# Patient Record
Sex: Female | Born: 1950 | Race: White | Hispanic: No | Marital: Married | Smoking: Former smoker
Health system: Southern US, Community
[De-identification: ages and names within clinical notes are randomized; demographics above are authoritative.]

## PROBLEM LIST (undated history)

## (undated) DIAGNOSIS — M1990 Unspecified osteoarthritis, unspecified site: Secondary | ICD-10-CM

## (undated) DIAGNOSIS — Z9889 Other specified postprocedural states: Secondary | ICD-10-CM

## (undated) DIAGNOSIS — E039 Hypothyroidism, unspecified: Secondary | ICD-10-CM

## (undated) DIAGNOSIS — T8859XA Other complications of anesthesia, initial encounter: Secondary | ICD-10-CM

## (undated) DIAGNOSIS — R112 Nausea with vomiting, unspecified: Secondary | ICD-10-CM

## (undated) HISTORY — PX: HERNIA REPAIR: SHX51

## (undated) HISTORY — PX: TONSILLECTOMY: SUR1361

## (undated) HISTORY — PX: BILATERAL KNEE ARTHROSCOPY: SUR91

---

## 1973-11-29 HISTORY — PX: TUBAL LIGATION: SHX77

## 1975-11-30 DIAGNOSIS — C801 Malignant (primary) neoplasm, unspecified: Secondary | ICD-10-CM

## 1975-11-30 HISTORY — DX: Malignant (primary) neoplasm, unspecified: C80.1

## 1975-11-30 HISTORY — PX: ABDOMINAL HYSTERECTOMY: SHX81

## 1976-08-29 HISTORY — PX: FOOT NEUROMA SURGERY: SHX646

## 1999-05-08 ENCOUNTER — Other Ambulatory Visit: Admission: RE | Admit: 1999-05-08 | Discharge: 1999-05-08 | Payer: Self-pay | Admitting: Obstetrics and Gynecology

## 1999-11-12 ENCOUNTER — Encounter: Payer: Self-pay | Admitting: Family Medicine

## 1999-11-12 ENCOUNTER — Ambulatory Visit (HOSPITAL_COMMUNITY): Admission: RE | Admit: 1999-11-12 | Discharge: 1999-11-12 | Payer: Self-pay | Admitting: Family Medicine

## 2000-01-13 ENCOUNTER — Encounter: Payer: Self-pay | Admitting: *Deleted

## 2000-01-13 ENCOUNTER — Encounter: Admission: RE | Admit: 2000-01-13 | Discharge: 2000-01-13 | Payer: Self-pay | Admitting: *Deleted

## 2000-01-21 ENCOUNTER — Encounter: Admission: RE | Admit: 2000-01-21 | Discharge: 2000-01-21 | Payer: Self-pay | Admitting: *Deleted

## 2000-01-21 ENCOUNTER — Encounter: Payer: Self-pay | Admitting: *Deleted

## 2001-05-04 ENCOUNTER — Encounter: Payer: Self-pay | Admitting: *Deleted

## 2001-05-04 ENCOUNTER — Encounter: Admission: RE | Admit: 2001-05-04 | Discharge: 2001-05-04 | Payer: Self-pay | Admitting: *Deleted

## 2001-05-26 ENCOUNTER — Other Ambulatory Visit: Admission: RE | Admit: 2001-05-26 | Discharge: 2001-05-26 | Payer: Self-pay | Admitting: *Deleted

## 2001-06-07 ENCOUNTER — Encounter: Payer: Self-pay | Admitting: *Deleted

## 2001-06-07 ENCOUNTER — Encounter: Admission: RE | Admit: 2001-06-07 | Discharge: 2001-06-07 | Payer: Self-pay | Admitting: *Deleted

## 2002-04-27 ENCOUNTER — Ambulatory Visit (HOSPITAL_COMMUNITY): Admission: RE | Admit: 2002-04-27 | Discharge: 2002-04-27 | Payer: Self-pay | Admitting: Gastroenterology

## 2002-04-27 ENCOUNTER — Encounter (INDEPENDENT_AMBULATORY_CARE_PROVIDER_SITE_OTHER): Payer: Self-pay

## 2003-01-09 ENCOUNTER — Encounter: Payer: Self-pay | Admitting: Family Medicine

## 2003-01-09 ENCOUNTER — Encounter: Admission: RE | Admit: 2003-01-09 | Discharge: 2003-01-09 | Payer: Self-pay | Admitting: Family Medicine

## 2004-04-30 ENCOUNTER — Other Ambulatory Visit: Admission: RE | Admit: 2004-04-30 | Discharge: 2004-04-30 | Payer: Self-pay | Admitting: Obstetrics & Gynecology

## 2004-09-04 ENCOUNTER — Encounter (INDEPENDENT_AMBULATORY_CARE_PROVIDER_SITE_OTHER): Payer: Self-pay | Admitting: Specialist

## 2004-09-04 ENCOUNTER — Ambulatory Visit (HOSPITAL_COMMUNITY): Admission: RE | Admit: 2004-09-04 | Discharge: 2004-09-04 | Payer: Self-pay | Admitting: General Surgery

## 2004-12-06 ENCOUNTER — Encounter: Admission: RE | Admit: 2004-12-06 | Discharge: 2004-12-06 | Payer: Self-pay | Admitting: Family Medicine

## 2005-05-19 ENCOUNTER — Other Ambulatory Visit: Admission: RE | Admit: 2005-05-19 | Discharge: 2005-05-19 | Payer: Self-pay | Admitting: Obstetrics & Gynecology

## 2005-10-11 ENCOUNTER — Inpatient Hospital Stay (HOSPITAL_COMMUNITY): Admission: RE | Admit: 2005-10-11 | Discharge: 2005-10-13 | Payer: Self-pay | Admitting: Obstetrics & Gynecology

## 2005-10-17 ENCOUNTER — Inpatient Hospital Stay (HOSPITAL_COMMUNITY): Admission: AD | Admit: 2005-10-17 | Discharge: 2005-10-17 | Payer: Self-pay | Admitting: Obstetrics & Gynecology

## 2009-03-28 ENCOUNTER — Encounter: Admission: RE | Admit: 2009-03-28 | Discharge: 2009-03-28 | Payer: Self-pay | Admitting: Family Medicine

## 2010-02-07 ENCOUNTER — Emergency Department (HOSPITAL_COMMUNITY): Admission: EM | Admit: 2010-02-07 | Discharge: 2010-02-07 | Payer: Self-pay | Admitting: Emergency Medicine

## 2011-01-29 ENCOUNTER — Other Ambulatory Visit: Payer: Self-pay | Admitting: Family Medicine

## 2011-01-29 DIAGNOSIS — R52 Pain, unspecified: Secondary | ICD-10-CM

## 2011-01-31 ENCOUNTER — Emergency Department (HOSPITAL_COMMUNITY)
Admission: EM | Admit: 2011-01-31 | Discharge: 2011-01-31 | Disposition: A | Discharge status: Home / Self Care | Payer: BC Managed Care – PPO | Attending: Emergency Medicine | Admitting: Emergency Medicine

## 2011-01-31 ENCOUNTER — Emergency Department (HOSPITAL_COMMUNITY): Payer: BC Managed Care – PPO

## 2011-01-31 DIAGNOSIS — R109 Unspecified abdominal pain: Secondary | ICD-10-CM | POA: Insufficient documentation

## 2011-01-31 DIAGNOSIS — E039 Hypothyroidism, unspecified: Secondary | ICD-10-CM | POA: Insufficient documentation

## 2011-01-31 DIAGNOSIS — Z79899 Other long term (current) drug therapy: Secondary | ICD-10-CM | POA: Insufficient documentation

## 2011-01-31 LAB — DIFFERENTIAL
Basophils Absolute: 0 10*3/uL (ref 0.0–0.1)
Lymphocytes Relative: 29 % (ref 12–46)
Lymphs Abs: 2.1 10*3/uL (ref 0.7–4.0)
Monocytes Absolute: 0.6 10*3/uL (ref 0.1–1.0)
Neutro Abs: 4.5 10*3/uL (ref 1.7–7.7)

## 2011-01-31 LAB — URINALYSIS, ROUTINE W REFLEX MICROSCOPIC
Bilirubin Urine: NEGATIVE
Glucose, UA: NEGATIVE mg/dL
Hgb urine dipstick: NEGATIVE
Nitrite: NEGATIVE
Specific Gravity, Urine: 1.01 (ref 1.005–1.030)
pH: 6 (ref 5.0–8.0)

## 2011-01-31 LAB — COMPREHENSIVE METABOLIC PANEL
ALT: 13 U/L (ref 0–35)
Alkaline Phosphatase: 55 U/L (ref 39–117)
CO2: 27 mEq/L (ref 19–32)
Calcium: 9 mg/dL (ref 8.4–10.5)
GFR calc non Af Amer: >60 mL/min (ref >60)
Glucose, Bld: 94 mg/dL (ref 70–99)
Potassium: 3.9 mEq/L (ref 3.5–5.1)
Sodium: 136 mEq/L (ref 135–145)

## 2011-01-31 LAB — LIPASE, BLOOD: Lipase: 32 U/L (ref 11–59)

## 2011-01-31 LAB — CBC
HCT: 38.5 % (ref 36.0–46.0)
Hemoglobin: 13.4 g/dL (ref 12.0–15.0)
MCHC: 34.8 g/dL (ref 30.0–36.0)

## 2011-01-31 MED ORDER — IOHEXOL 300 MG/ML  SOLN
100.0000 mL | Freq: Once | INTRAMUSCULAR | Status: AC | PRN
  Administered 2011-01-31: 80 mL via INTRAVENOUS

## 2011-02-01 ENCOUNTER — Other Ambulatory Visit: Payer: Self-pay

## 2011-02-21 LAB — COMPREHENSIVE METABOLIC PANEL
ALT: 18 U/L (ref 0–35)
AST: 19 U/L (ref 0–37)
Calcium: 9.1 mg/dL (ref 8.4–10.5)
GFR calc Af Amer: >60 mL/min (ref >60)
Glucose, Bld: 96 mg/dL (ref 70–99)
Sodium: 138 mEq/L (ref 135–145)
Total Protein: 7.1 g/dL (ref 6.0–8.3)

## 2011-02-21 LAB — URINALYSIS, ROUTINE W REFLEX MICROSCOPIC
Bilirubin Urine: NEGATIVE
Ketones, ur: NEGATIVE mg/dL
Nitrite: NEGATIVE
Protein, ur: NEGATIVE mg/dL
Urobilinogen, UA: 0.2 mg/dL (ref 0.0–1.0)

## 2011-02-21 LAB — DIFFERENTIAL
Eosinophils Absolute: 0.1 10*3/uL (ref 0.0–0.7)
Lymphs Abs: 2.1 10*3/uL (ref 0.7–4.0)
Monocytes Relative: 8 % (ref 3–12)
Neutrophils Relative %: 55 % (ref 43–77)

## 2011-02-21 LAB — CBC
MCHC: 32.8 g/dL (ref 30.0–36.0)
RDW: 12.8 % (ref 11.5–15.5)

## 2011-04-16 NOTE — Discharge Summary (Signed)
[name], [name]              ACCOUNT NO.:  [id number]   MEDICAL RECORD NO.:  [id number]          PATIENT TYPE:  INP   LOCATION:  9302                          FACILITY:  [facility]   PHYSICIAN:  Freddy Finner, M.D.   DATE OF BIRTH:  28-Sep-1951   DATE OF ADMISSION:  10/11/2005  DATE OF DISCHARGE:  10/13/2005                                 DISCHARGE SUMMARY   DISCHARGE DIAGNOSES:  First-degree cystocele, third degree rectocele,  vaginal vault descensus.   OPERATIVE PROCEDURE:  Anterior and posterior vaginal repair, sacrospinous  ligament suspension of vagina.   INTRAOPERATIVE AND POSTOPERATIVE COMPLICATIONS:  None.   DISPOSITION:  The patient was in satisfactory condition at the time of her  discharge.  She was voiding without difficulty with minimal residuals. Her  suprapubic catheter was removed.  She was discharged home to resume Vivelle-  Dot 0.1 for estrogen replacement therapy. She is to resume her normal dose  of Synthroid. She was given Percocet 5/325 as needed for postoperative pain.  She is to follow-up in the office in approximately 2 weeks.   Details of present illness, past history, family history of systems and  physical exam recorded in the admission note. Basically, the physical  findings on her admission were remarkable for her cystourethrocele and for  her rectocele. Other physical parameters were normal.   LABORATORY DATA:  Laboratory data during this admission includes admission  hemoglobin of 14.6 with an essentially normal CBC.  Postoperative hemoglobin  was 11.1. Urinalysis on admission was normal.  Prothrombin time and PTT on  admission were normal.   HOSPITAL COURSE:  The patient was admitted on the morning of surgery.  The  above-described procedure was accomplished without intraoperative  difficulties or complications. Her postoperative course was entirely  satisfactory, and by of the morning of the second postoperative day she was  discharged home  with disposition as noted above.      Freddy Finner, M.D.  Electronically Signed     WRN/MEDQ  D:  11/11/2005  T:  11/12/2005  Job:  [id number]

## 2011-04-16 NOTE — Procedures (Signed)
[facility]  Patient:    [name], [name] Visit Number: [id number] MRN: [id number]          Service Type: END Location: ENDO Attending Physician:  Louie Bun Dictated by:   Everardo All Madilyn Fireman, M.D. Proc. Date: 04/27/02 Admit Date:  04/27/2002   CC:         Sharlet Salina T. Hoxworth, M.D.   Procedure Report  PROCEDURE:  Colonoscopy with polypectomy.  INDICATION FOR PROCEDURE:  Rectal bleeding in a 60 year old patient who has a family history of colon polyps in both parents.  DESCRIPTION OF PROCEDURE:  The patient was placed in the left lateral decubitus position and placed on the pulse monitor with continuous low-flow oxygen delivered by nasal cannula.  She was sedated with 20 mg of IV Demerol and 2 mg of IV Versed in addition to the medicine received for the previous EGD.  The Olympus video colonoscope was inserted into the rectum and advanced to the cecum, confirmed by transillumination at McBurneys point and visualization of the ileocecal valve and appendiceal orifice.  The prep was excellent.  The cecum appeared normal.  Within the ascending colon there was seen a sessile 6 mm polyp that was fulgurated by hot biopsy.  The remainder of the ascending and transverse colon appeared normal with no further polyps, masses, diverticula, or other mucosal abnormalities.  Within the descending and sigmoid colon, there were seen several small scattered diverticula.  In the rectum, there was an 8 mm polyp at approximately 12 cm which was removed by snare.  The remainder of the rectum appeared normal down to the anus where retroflexed view revealed no obviously enlarged internal hemorrhoids.  The colonoscope was then withdrawn, and the patient returned to the recovery room in stable condition.  She tolerated the procedure well, and there were no immediate complications.  IMPRESSION: 1. Small ascending and rectal polyps. 2. Sigmoid diverticulosis.  PLAN:   Await histology for determination of interval for next colonoscopy. Dictated by:   Everardo All Madilyn Fireman, M.D. Attending Physician:  Louie Bun DD:  04/27/02 TD:  04/28/02 Job: [id number]

## 2011-04-16 NOTE — Procedures (Signed)
[facility]  Patient:    [name], [name] Visit Number: [id number] MRN: [id number]          Service Type: END Location: ENDO Attending Physician:  Louie Bun Dictated by:   Everardo All Madilyn Fireman, M.D. Proc. Date: 04/27/02 Admit Date:  04/27/2002   CC:         Sharlet Salina T. Hoxworth, M.D.   Procedure Report  PROCEDURE:  Esophagogastroduodenoscopy.  INDICATION FOR PROCEDURE:  Gastroesophageal reflux symptoms only partially suppressed by proton pump inhibitor with some early satiety and bloating in a 60 year old patient, who is also undergoing colonoscopy today for rectal bleeding.  DESCRIPTION OF PROCEDURE:  The patient was placed in the left lateral decubitus position and placed on the pulse monitor with continuous low-flow oxygen delivered by nasal cannula.  She was sedated with 60 mg IV Versed and 6 mg of IV Versed.  The Olympus video endoscope was advanced under direct vision into the oropharynx and esophagus.  The esophagus was straight and of normal caliber with the squamocolumnar line sharply identified at 38 cm. There was no visible hiatal hernia, ring, stricture, or other abnormality of the GE junction or distal esophagus.  The stomach was entered, and a small amount of liquid secretions were suctioned from the fundus.  Retroflexed view of the cardia was unremarkable.  The fundus and body appeared normal.  The antrum showed some increased erythema and diffuse granularity with no focal erosions or ulcers consistent in appearance with a mild antral gastritis.  A CLOtest was obtained.  The pylorus was nondeformed and easily allowed passage of the endoscope tip into the duodenum.  Both the bulb and the second portion were well-inspected and appeared to be within normal limits.  The scope was then withdrawn, and the patient returned to the recovery room in stable condition.  She tolerated the procedure well, and there were no  immediate complications.  IMPRESSION: 1. Mild antral gastritis. 2. Otherwise normal endoscopy.  PLAN: 1. Await CLOtest. 2. We will proceed with colonoscopy as scheduled. Dictated by:   Everardo All Madilyn Fireman, M.D. Attending Physician:  Louie Bun DD:  04/27/02 TD:  04/28/02 Job: [id number]

## 2011-04-16 NOTE — Op Note (Signed)
[name], [name]              ACCOUNT NO.:  [id number]   MEDICAL RECORD NO.:  [id number]          PATIENT TYPE:  INP   LOCATION:  9399                          FACILITY:  [facility]   PHYSICIAN:  Freddy Finner, M.D.   DATE OF BIRTH:  04-Oct-1951   DATE OF PROCEDURE:  10/11/2005  DATE OF DISCHARGE:                                 OPERATIVE REPORT   PREOPERATIVE DIAGNOSES:  Third degree rectocele, first degree  cystourethrocele, vaginal vault descensus.   POSTOPERATIVE DIAGNOSES:  Third degree rectocele, first degree  cystourethrocele, vaginal vault descensus.   OPERATION/PROCEDURE:  Anterior and posterior vaginal repair, sacrospinous  ligament suspension of vagina.   SURGEON:  Dr. Jennette Kettle   ASSISTANT:  Dr. Arelia Sneddon   ANESTHESIA:  Spinal with IV sedation.   ESTIMATED INTRAOPERATIVE BLOOD LOSS:  Less than or equal to 100 mL.   INTRAOPERATIVE COMPLICATIONS:  None.   Details of the present illness are recorded in the admission note.  Patient  was admitted on the morning of surgery.  She was given a bolus of Rocephin  IV preoperatively.  She was brought to the operating room.  She was placed  in PAS compression hose.  She was placed under adequate spinal anesthesia.  She was placed in the dorsal lithotomy position using the Franklin stirrups  system.  She was given IV sedation.  The mons, perineum, and vagina were  prepped in the usual fashion with Betadine scrub followed by Betadine  solution.  Sterile drapes were applied.  Posterior weighted vaginal  retractor was placed.  Anterior vaginal mucosa along the anterior wall was  grasped with Lavena Bullion' from a level approximately 1 cm distal to the vaginal  cuff.  A midline incision was made sharply.  This was extended from along  the anterior wall to level approximately 1 cm proximal to the urethral  meatus.  Allis clamps were used to grasp the mucosal edges.  Careful sharp  and blunt dissection was carried out to free the urogenital fascia from  the  bladder and from the vaginal mucosa.  Plication sutures of 0 Monocryl were  then placed to elevate the urethrovesical angle and to reduce the cystocele.  Segments of mucosa were excised.  Urethral length was checked and patency of  urethra checked with a sterile Kelly.  Urethral length was at least 2.5 cm.  The incision was then closed with a running 2-0 Monocryl suture.  Attention  was turned posteriorly.  Fourchette was grasped on each side with Allis'.  A  parametal shaped segment of skin was excised from the perineal body with  apex inferiorly.  Mucosa overlying the rectum was then tented with Allis'  and an incision made along the posterior midline of the mucosa.  Again,  careful blunt and sharp dissection was used to free the perirectal tissue  from the vaginal mucosa.  Dissection was carried to a level near the apex of  the vagina.  Using a __________  device and a Vicryl suture, suture was  placed in the right sacrospinous ligament midway between the spine and the  sacrum.  This  was anchored to the vaginal mucosa near the apex of the  vagina.  Plication sutures of 0 Monocryl were then used to recreate a  rectovaginal septum using perirectal tissue laterally on each side.  Levators were elevated and approximated but the perineal body was closed  also with an interrupted 0 Monocryl suture.  Segments of mucosa were  excised.  The posterior closure was started running 2-0 Monocryl along the  posterior vaginal incision.  The sacrospinous ligament suspension stitch was  then tied adequately elevating the vaginal vault.  The closure was completed  __________ the episiotomy with the running 2-0 Monocryl suture.  Bladder was  then filled with 300 mL of irrigating solution  __________  catheter was placed without difficulty and anchored to the skin  with interrupted nylon sutures.  Bladder was evacuated, 2-inch plain gauze  pack was placed.  Patient was then awakened and taken to the  recovery room  in good condition.      Freddy Finner, M.D.  Electronically Signed     WRN/MEDQ  D:  10/11/2005  T:  10/11/2005  Job:  [id number]

## 2011-04-16 NOTE — H&P (Signed)
[name], [name]              ACCOUNT NO.:  [id number]   MEDICAL RECORD NO.:  [id number]          PATIENT TYPE:  AMB   LOCATION:  SDC                           FACILITY:  [facility]   PHYSICIAN:  Freddy Finner, M.D.   DATE OF BIRTH:  July 18, 1951   DATE OF ADMISSION:  DATE OF DISCHARGE:                                HISTORY & PHYSICAL   ADMISSION DIAGNOSIS:  Rectocele with third degree prolapse, first degree  cystourethrocele, symptomatic.   HISTORY OF PRESENT ILLNESS:  The patient is a 60 year old white married  female, status post total vaginal hysterectomy, gravida 3, para 2, who is  currently postmenopausal and on hormone replacement therapy including  Vivelle 0.075 twice weekly and on Estrace vaginal cream.  Her menopausal  symptoms have adequately responded to hormone replacement therapy, but she  has progressively increasing difficulty with pressure, difficulty evacuating  her bowel, and urinary stress incontinence.  She is admitted now for  anterior and posterior vaginal repair, sacrospinous ligament suspension.  The patient is known to be hypothyroid.  She has no other known medical  conditions.  For that, she takes Synthroid.  Her only other medications are  listed above.  Her Synthroid dose is 0.75 mg.   PAST SURGICAL HISTORY:  Excision of a tumor on her foot, total vaginal  hysterectomy done in 1977.  She has had two vaginal births.   ALLERGIES:  SOME PAIN MEDICATIONS which cause nausea and vomiting, but is  not allergic to codeine.   SOCIAL HISTORY:  She drinks alcohol occasionally.  She is a nonsmoker.  She  has never had a blood transfusion.   FAMILY HISTORY:  Remarkable for heart disease in her father and hypertension  in her mother.   PHYSICAL EXAMINATION:  HEENT:  Grossly within normal limits.  VITAL SIGNS:  Blood pressure in the office 100/64, thyroid gland is not  palpably enlarged.  CHEST:  Clear to auscultation.  HEART:  Normal sinus rhythm without  murmurs, rubs, or gallops.  BREASTS:  Normal.  There is no palpable nodularity.  No skin change, no  nipple discharge.  ABDOMEN:  Soft and nontender without appreciable organomegaly or palpable  masses.  PELVIC:  External genitalia and vagina are normal to inspection except for  the relaxation of the vaginal outlet.  Third degree rectocele and first  degree cystocele.  Bimanual reveals no palpable masses.  RECTAL:  Palpably normal.   Symptomatic pelvic relaxation with third degree rectocele, first degree  cystourethrocele, descent of vaginal vault.   PLAN:  Anterior and posterior vaginal repair, sacrospinous ligament  suspension.     Freddy Finner, M.D.  Electronically Signed    WRN/MEDQ  D:  10/10/2005  T:  10/10/2005  Job:  [id number]
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
## 2012-02-28 ENCOUNTER — Encounter: Payer: BC Managed Care – PPO | Admitting: Physical Therapy

## 2012-03-10 ENCOUNTER — Ambulatory Visit
Admission: RE | Admit: 2012-03-10 | Discharge: 2012-03-10 | Disposition: A | Discharge status: Home / Self Care | Payer: BC Managed Care – PPO | Source: Ambulatory Visit | Attending: Family Medicine | Admitting: Family Medicine

## 2012-03-10 ENCOUNTER — Other Ambulatory Visit: Payer: Self-pay | Admitting: Family Medicine

## 2012-03-10 DIAGNOSIS — R1031 Right lower quadrant pain: Secondary | ICD-10-CM

## 2012-03-10 MED ORDER — IOHEXOL 300 MG/ML  SOLN
100.0000 mL | Freq: Once | INTRAMUSCULAR | Status: AC | PRN
  Administered 2012-03-10: 100 mL via INTRAVENOUS

## 2012-11-29 HISTORY — PX: ANTERIOR AND POSTERIOR REPAIR: SHX1172

## 2014-11-29 DIAGNOSIS — I209 Angina pectoris, unspecified: Secondary | ICD-10-CM

## 2014-11-29 HISTORY — DX: Angina pectoris, unspecified: I20.9

## 2014-11-29 HISTORY — PX: APPENDECTOMY: SHX54

## 2015-02-27 ENCOUNTER — Other Ambulatory Visit: Payer: Self-pay | Admitting: Family Medicine

## 2015-02-27 DIAGNOSIS — R1030 Lower abdominal pain, unspecified: Secondary | ICD-10-CM

## 2015-02-28 ENCOUNTER — Ambulatory Visit
Admission: RE | Admit: 2015-02-28 | Discharge: 2015-02-28 | Disposition: A | Discharge status: Home / Self Care | Payer: BLUE CROSS/BLUE SHIELD | Source: Ambulatory Visit | Attending: Family Medicine | Admitting: Family Medicine

## 2015-02-28 DIAGNOSIS — R1030 Lower abdominal pain, unspecified: Secondary | ICD-10-CM

## 2015-02-28 MED ORDER — IOPAMIDOL (ISOVUE-300) INJECTION 61%
100.0000 mL | Freq: Once | INTRAVENOUS | Status: AC | PRN
  Administered 2015-02-28: 100 mL via INTRAVENOUS

## 2015-06-25 ENCOUNTER — Other Ambulatory Visit: Payer: Self-pay | Admitting: Orthopedic Surgery

## 2015-06-25 ENCOUNTER — Ambulatory Visit
Admission: RE | Admit: 2015-06-25 | Discharge: 2015-06-25 | Disposition: A | Discharge status: Home / Self Care | Payer: BLUE CROSS/BLUE SHIELD | Source: Ambulatory Visit | Attending: Orthopedic Surgery | Admitting: Orthopedic Surgery

## 2015-06-25 DIAGNOSIS — M79671 Pain in right foot: Secondary | ICD-10-CM

## 2015-07-29 ENCOUNTER — Other Ambulatory Visit: Payer: Self-pay | Admitting: Orthopedic Surgery

## 2016-03-04 ENCOUNTER — Other Ambulatory Visit: Payer: Self-pay | Admitting: Obstetrics and Gynecology

## 2016-03-05 LAB — CYTOLOGY - PAP

## 2017-03-11 DIAGNOSIS — J309 Allergic rhinitis, unspecified: Secondary | ICD-10-CM | POA: Diagnosis not present

## 2017-07-27 DIAGNOSIS — R69 Illness, unspecified: Secondary | ICD-10-CM | POA: Diagnosis not present

## 2017-10-10 DIAGNOSIS — Z23 Encounter for immunization: Secondary | ICD-10-CM | POA: Diagnosis not present

## 2017-10-10 DIAGNOSIS — J309 Allergic rhinitis, unspecified: Secondary | ICD-10-CM | POA: Diagnosis not present

## 2017-10-10 DIAGNOSIS — R109 Unspecified abdominal pain: Secondary | ICD-10-CM | POA: Diagnosis not present

## 2017-10-10 DIAGNOSIS — Z0000 Encounter for general adult medical examination without abnormal findings: Secondary | ICD-10-CM | POA: Diagnosis not present

## 2017-10-10 DIAGNOSIS — Z1231 Encounter for screening mammogram for malignant neoplasm of breast: Secondary | ICD-10-CM | POA: Diagnosis not present

## 2017-10-10 DIAGNOSIS — E7800 Pure hypercholesterolemia, unspecified: Secondary | ICD-10-CM | POA: Diagnosis not present

## 2017-10-10 DIAGNOSIS — K582 Mixed irritable bowel syndrome: Secondary | ICD-10-CM | POA: Diagnosis not present

## 2017-10-10 DIAGNOSIS — E039 Hypothyroidism, unspecified: Secondary | ICD-10-CM | POA: Diagnosis not present

## 2017-10-10 DIAGNOSIS — R69 Illness, unspecified: Secondary | ICD-10-CM | POA: Diagnosis not present

## 2017-10-10 DIAGNOSIS — E2839 Other primary ovarian failure: Secondary | ICD-10-CM | POA: Diagnosis not present

## 2017-12-14 DIAGNOSIS — E2839 Other primary ovarian failure: Secondary | ICD-10-CM | POA: Diagnosis not present

## 2017-12-23 DIAGNOSIS — H2513 Age-related nuclear cataract, bilateral: Secondary | ICD-10-CM | POA: Diagnosis not present

## 2017-12-23 DIAGNOSIS — H43813 Vitreous degeneration, bilateral: Secondary | ICD-10-CM | POA: Diagnosis not present

## 2017-12-23 DIAGNOSIS — H524 Presbyopia: Secondary | ICD-10-CM | POA: Diagnosis not present

## 2017-12-23 DIAGNOSIS — H04123 Dry eye syndrome of bilateral lacrimal glands: Secondary | ICD-10-CM | POA: Diagnosis not present

## 2017-12-23 DIAGNOSIS — H02885 Meibomian gland dysfunction left lower eyelid: Secondary | ICD-10-CM | POA: Diagnosis not present

## 2017-12-23 DIAGNOSIS — H52223 Regular astigmatism, bilateral: Secondary | ICD-10-CM | POA: Diagnosis not present

## 2017-12-28 DIAGNOSIS — M25562 Pain in left knee: Secondary | ICD-10-CM | POA: Diagnosis not present

## 2018-01-10 DIAGNOSIS — E7800 Pure hypercholesterolemia, unspecified: Secondary | ICD-10-CM | POA: Diagnosis not present

## 2018-01-16 DIAGNOSIS — H00014 Hordeolum externum left upper eyelid: Secondary | ICD-10-CM | POA: Diagnosis not present

## 2018-01-16 DIAGNOSIS — H2513 Age-related nuclear cataract, bilateral: Secondary | ICD-10-CM | POA: Diagnosis not present

## 2018-01-16 DIAGNOSIS — H02885 Meibomian gland dysfunction left lower eyelid: Secondary | ICD-10-CM | POA: Diagnosis not present

## 2018-01-16 DIAGNOSIS — H43813 Vitreous degeneration, bilateral: Secondary | ICD-10-CM | POA: Diagnosis not present

## 2018-01-16 DIAGNOSIS — H04123 Dry eye syndrome of bilateral lacrimal glands: Secondary | ICD-10-CM | POA: Diagnosis not present

## 2018-01-19 DIAGNOSIS — M654 Radial styloid tenosynovitis [de Quervain]: Secondary | ICD-10-CM | POA: Diagnosis not present

## 2018-03-07 DIAGNOSIS — M654 Radial styloid tenosynovitis [de Quervain]: Secondary | ICD-10-CM | POA: Diagnosis not present

## 2018-05-22 DIAGNOSIS — M654 Radial styloid tenosynovitis [de Quervain]: Secondary | ICD-10-CM | POA: Diagnosis not present

## 2018-08-25 DIAGNOSIS — Z23 Encounter for immunization: Secondary | ICD-10-CM | POA: Diagnosis not present

## 2018-10-11 DIAGNOSIS — K582 Mixed irritable bowel syndrome: Secondary | ICD-10-CM | POA: Diagnosis not present

## 2018-10-11 DIAGNOSIS — J309 Allergic rhinitis, unspecified: Secondary | ICD-10-CM | POA: Diagnosis not present

## 2018-10-11 DIAGNOSIS — Z1239 Encounter for other screening for malignant neoplasm of breast: Secondary | ICD-10-CM | POA: Diagnosis not present

## 2018-10-11 DIAGNOSIS — E7800 Pure hypercholesterolemia, unspecified: Secondary | ICD-10-CM | POA: Diagnosis not present

## 2018-10-11 DIAGNOSIS — J0100 Acute maxillary sinusitis, unspecified: Secondary | ICD-10-CM | POA: Diagnosis not present

## 2018-10-11 DIAGNOSIS — Z1159 Encounter for screening for other viral diseases: Secondary | ICD-10-CM | POA: Diagnosis not present

## 2018-10-11 DIAGNOSIS — Z0000 Encounter for general adult medical examination without abnormal findings: Secondary | ICD-10-CM | POA: Diagnosis not present

## 2018-10-11 DIAGNOSIS — Z23 Encounter for immunization: Secondary | ICD-10-CM | POA: Diagnosis not present

## 2018-10-11 DIAGNOSIS — E039 Hypothyroidism, unspecified: Secondary | ICD-10-CM | POA: Diagnosis not present

## 2018-12-19 DIAGNOSIS — H2513 Age-related nuclear cataract, bilateral: Secondary | ICD-10-CM | POA: Diagnosis not present

## 2018-12-19 DIAGNOSIS — H43813 Vitreous degeneration, bilateral: Secondary | ICD-10-CM | POA: Diagnosis not present

## 2018-12-19 DIAGNOSIS — H52223 Regular astigmatism, bilateral: Secondary | ICD-10-CM | POA: Diagnosis not present

## 2018-12-19 DIAGNOSIS — H524 Presbyopia: Secondary | ICD-10-CM | POA: Diagnosis not present

## 2018-12-19 DIAGNOSIS — H04123 Dry eye syndrome of bilateral lacrimal glands: Secondary | ICD-10-CM | POA: Diagnosis not present

## 2018-12-19 DIAGNOSIS — H02885 Meibomian gland dysfunction left lower eyelid: Secondary | ICD-10-CM | POA: Diagnosis not present

## 2019-01-04 DIAGNOSIS — M654 Radial styloid tenosynovitis [de Quervain]: Secondary | ICD-10-CM | POA: Diagnosis not present

## 2019-01-19 DIAGNOSIS — Z0100 Encounter for examination of eyes and vision without abnormal findings: Secondary | ICD-10-CM | POA: Diagnosis not present

## 2019-03-20 DIAGNOSIS — M654 Radial styloid tenosynovitis [de Quervain]: Secondary | ICD-10-CM | POA: Diagnosis not present

## 2019-07-09 ENCOUNTER — Other Ambulatory Visit: Payer: Self-pay | Admitting: Family Medicine

## 2019-07-09 DIAGNOSIS — Z1231 Encounter for screening mammogram for malignant neoplasm of breast: Secondary | ICD-10-CM

## 2019-07-12 ENCOUNTER — Ambulatory Visit
Admission: RE | Admit: 2019-07-12 | Discharge: 2019-07-12 | Disposition: A | Discharge status: Home / Self Care | Payer: BLUE CROSS/BLUE SHIELD | Source: Ambulatory Visit | Attending: Family Medicine | Admitting: Family Medicine

## 2019-07-12 ENCOUNTER — Other Ambulatory Visit: Payer: Self-pay

## 2019-07-12 DIAGNOSIS — Z1231 Encounter for screening mammogram for malignant neoplasm of breast: Secondary | ICD-10-CM | POA: Diagnosis not present

## 2019-08-14 DIAGNOSIS — R69 Illness, unspecified: Secondary | ICD-10-CM | POA: Diagnosis not present

## 2019-10-22 DIAGNOSIS — E7800 Pure hypercholesterolemia, unspecified: Secondary | ICD-10-CM | POA: Diagnosis not present

## 2019-10-22 DIAGNOSIS — Z0000 Encounter for general adult medical examination without abnormal findings: Secondary | ICD-10-CM | POA: Diagnosis not present

## 2019-10-22 DIAGNOSIS — K582 Mixed irritable bowel syndrome: Secondary | ICD-10-CM | POA: Diagnosis not present

## 2019-10-22 DIAGNOSIS — E039 Hypothyroidism, unspecified: Secondary | ICD-10-CM | POA: Diagnosis not present

## 2019-11-14 DIAGNOSIS — D225 Melanocytic nevi of trunk: Secondary | ICD-10-CM | POA: Diagnosis not present

## 2019-11-14 DIAGNOSIS — L821 Other seborrheic keratosis: Secondary | ICD-10-CM | POA: Diagnosis not present

## 2019-11-14 DIAGNOSIS — D2272 Melanocytic nevi of left lower limb, including hip: Secondary | ICD-10-CM | POA: Diagnosis not present

## 2019-11-14 DIAGNOSIS — D2271 Melanocytic nevi of right lower limb, including hip: Secondary | ICD-10-CM | POA: Diagnosis not present

## 2019-11-14 DIAGNOSIS — D1801 Hemangioma of skin and subcutaneous tissue: Secondary | ICD-10-CM | POA: Diagnosis not present

## 2019-11-14 DIAGNOSIS — L814 Other melanin hyperpigmentation: Secondary | ICD-10-CM | POA: Diagnosis not present

## 2019-12-25 DIAGNOSIS — H43813 Vitreous degeneration, bilateral: Secondary | ICD-10-CM | POA: Diagnosis not present

## 2019-12-25 DIAGNOSIS — H02885 Meibomian gland dysfunction left lower eyelid: Secondary | ICD-10-CM | POA: Diagnosis not present

## 2019-12-25 DIAGNOSIS — H524 Presbyopia: Secondary | ICD-10-CM | POA: Diagnosis not present

## 2019-12-25 DIAGNOSIS — H04123 Dry eye syndrome of bilateral lacrimal glands: Secondary | ICD-10-CM | POA: Diagnosis not present

## 2019-12-25 DIAGNOSIS — H2513 Age-related nuclear cataract, bilateral: Secondary | ICD-10-CM | POA: Diagnosis not present

## 2019-12-25 DIAGNOSIS — H52223 Regular astigmatism, bilateral: Secondary | ICD-10-CM | POA: Diagnosis not present

## 2020-04-23 DIAGNOSIS — E7800 Pure hypercholesterolemia, unspecified: Secondary | ICD-10-CM | POA: Diagnosis not present

## 2020-04-23 DIAGNOSIS — E039 Hypothyroidism, unspecified: Secondary | ICD-10-CM | POA: Diagnosis not present

## 2020-04-30 DIAGNOSIS — R1909 Other intra-abdominal and pelvic swelling, mass and lump: Secondary | ICD-10-CM | POA: Diagnosis not present

## 2020-04-30 DIAGNOSIS — E039 Hypothyroidism, unspecified: Secondary | ICD-10-CM | POA: Diagnosis not present

## 2020-05-02 ENCOUNTER — Other Ambulatory Visit: Payer: Self-pay | Admitting: Physician Assistant

## 2020-05-02 DIAGNOSIS — R1909 Other intra-abdominal and pelvic swelling, mass and lump: Secondary | ICD-10-CM

## 2020-05-19 ENCOUNTER — Ambulatory Visit
Admission: RE | Admit: 2020-05-19 | Discharge: 2020-05-19 | Disposition: A | Discharge status: Home / Self Care | Payer: Medicare HMO | Source: Ambulatory Visit | Attending: Physician Assistant | Admitting: Physician Assistant

## 2020-05-19 DIAGNOSIS — R1909 Other intra-abdominal and pelvic swelling, mass and lump: Secondary | ICD-10-CM

## 2020-05-22 DIAGNOSIS — M5416 Radiculopathy, lumbar region: Secondary | ICD-10-CM | POA: Diagnosis not present

## 2020-06-04 DIAGNOSIS — M5416 Radiculopathy, lumbar region: Secondary | ICD-10-CM | POA: Diagnosis not present

## 2020-06-04 DIAGNOSIS — R030 Elevated blood-pressure reading, without diagnosis of hypertension: Secondary | ICD-10-CM | POA: Diagnosis not present

## 2020-06-10 DIAGNOSIS — M5127 Other intervertebral disc displacement, lumbosacral region: Secondary | ICD-10-CM | POA: Diagnosis not present

## 2020-06-10 DIAGNOSIS — M5416 Radiculopathy, lumbar region: Secondary | ICD-10-CM | POA: Diagnosis not present

## 2020-06-12 DIAGNOSIS — K4090 Unilateral inguinal hernia, without obstruction or gangrene, not specified as recurrent: Secondary | ICD-10-CM | POA: Diagnosis not present

## 2020-06-15 ENCOUNTER — Ambulatory Visit: Payer: Self-pay | Admitting: General Surgery

## 2020-06-15 NOTE — H&P (Signed)
[name] Appointment: 06/12/2020 10:30 AM Location: [facility] Patient #: [id number] DOB: [birth year] Married / Language: Cleophus Molt / Race: White Female  History of Present Illness Randall Hiss M. Dawn Kiper MD; 06/15/2020 2:58 PM) The patient is a 69 year old female who presents with an inguinal hernia. She is referred by Maude Leriche PA For evaluation of a left inguinal hernia. The patient states about 4 months ago she noticed a lump in her left groin. She initially thought it was a cyst but it was persistent so she saw her primary care team which ordered an ultrasound which I reviewed. She underwent an ultrasound on June 21 which was consistent with a left inguinal hernia. I also reviewed the PCPs note. She states that she has had a partial hysterectomy through her vagina because of a remote history of cervical cancer. The bulge comes and goes. She also reports that she has had a cystocele and rectocele repair. She is currently having a lot of left hip issues and pain. It is causing difficulty walking. She has had an MRI of her back and has follow-up with neurosurgery next week. She has pain from her left hip to her left knee. She feels that there is a knife going through her hip. She denies any burning, shooting or stabbing pain in her groin. No tobacco use. No chest pain, chest pressure, shortness of breath, orthopnea, dyspnea exertion, TIAs or amaurosis fugax. The hernia has not been hard or firm   Problem List/Past Medical Randall Hiss M. Redmond Pulling, MD; 06/15/2020 2:59 PM) LEFT INGUINAL HERNIA (K40.90)  Past Surgical History Emeline Gins, [place]; 06/12/2020 10:07 AM) Appendectomy Colon Polyp Removal - Colonoscopy Foot Surgery Bilateral. Hysterectomy (due to cancer) - Partial Knee Surgery Bilateral.  Diagnostic Studies History Emeline Gins, CMA; 06/12/2020 10:07 AM) Colonoscopy 5-10 years ago Pap Smear 1-5 years ago  Allergies Emeline Gins, CMA; 06/12/2020 10:08  AM) No Known Drug Allergies [06/12/2020]: Allergies Reconciled  Medication History Emeline Gins, CMA; 06/12/2020 10:08 AM) Synthroid (88MCG Tablet, Oral) Active. Medications Reconciled  Social History Emeline Gins, [place]; 06/12/2020 10:07 AM) Alcohol use Occasional alcohol use. No caffeine use No drug use Tobacco use Former smoker.  Family History Emeline Gins, [place]; 06/12/2020 10:07 AM) Arthritis Father. Cancer Brother. Heart Disease Father. Hypertension Mother, Sister. Kidney Disease Brother. Ovarian Cancer Mother. Prostate Cancer Brother. Respiratory Condition Brother. Seizure disorder Brother. Thyroid problems Father.  Pregnancy / Birth History Emeline Gins, [place]; 06/12/2020 10:07 AM) Age at menarche 52 years. Age of menopause 78-50 Contraceptive History Oral contraceptives. Gravida 3  Other Problems Randall Hiss M. Redmond Pulling, MD; 06/15/2020 2:59 PM) Anxiety Disorder Arthritis Back Pain Cervical Cancer Diverticulosis Gastroesophageal Reflux Disease Hemorrhoids Inguinal Hernia Thyroid Disease Umbilical Hernia Repair     Review of Systems Emeline Gins CMA; 06/12/2020 10:07 AM) General Not Present- Appetite Loss, Chills, Fatigue, Fever, Night Sweats, Weight Gain and Weight Loss. Skin Not Present- Change in Wart/Mole, Dryness, Hives, Jaundice, New Lesions, Non-Healing Wounds, Rash and Ulcer. HEENT Present- Seasonal Allergies. Not Present- Earache, Hearing Loss, Hoarseness, Nose Bleed, Oral Ulcers, Ringing in the Ears, Sinus Pain, Sore Throat, Visual Disturbances, Wears glasses/contact lenses and Yellow Eyes. Respiratory Not Present- Bloody sputum, Chronic Cough, Difficulty Breathing, Snoring and Wheezing. Breast Not Present- Breast Mass, Breast Pain, Nipple Discharge and Skin Changes. Cardiovascular Not Present- Chest Pain, Difficulty Breathing Lying Down, Leg Cramps, Palpitations, Rapid Heart Rate, Shortness of Breath and Swelling  of Extremities. Female Genitourinary Not Present- Frequency, Nocturia, Painful Urination, Pelvic Pain and Urgency. Neurological Not Present-  Decreased Memory, Fainting, Headaches, Numbness, Seizures, Tingling, Tremor, Trouble walking and Weakness. Psychiatric Present- Anxiety. Not Present- Bipolar, Change in Sleep Pattern, Depression, Fearful and Frequent crying. Endocrine Not Present- Cold Intolerance, Excessive Hunger, Hair Changes, Heat Intolerance, Hot flashes and New Diabetes. Hematology Not Present- Blood Thinners, Easy Bruising, Excessive bleeding, Gland problems, HIV and Persistent Infections.  Vitals Emeline Gins CMA; 06/12/2020 10:08 AM) 06/12/2020 10:07 AM Weight: 153.2 lb Height: 66in Body Surface Area: 1.79 m Body Mass Index: 24.73 kg/m  Temp.: 97.80F  Pulse: 71 (Regular)  BP: 124/78(Sitting, Left Arm, Standard)        Physical Exam Randall Hiss M. Analynn Daum MD; 06/15/2020 2:54 PM)  General Mental Status-Alert. General Appearance-Consistent with stated age. Hydration-Well hydrated. Voice-Normal.  Head and Neck Head-normocephalic, atraumatic with no lesions or palpable masses. Trachea-midline. Thyroid Gland Characteristics - normal size and consistency.  Eye Eyeball - Bilateral-Extraocular movements intact. Sclera/Conjunctiva - Bilateral-No scleral icterus.  Chest and Lung Exam Chest and lung exam reveals -quiet, even and easy respiratory effort with no use of accessory muscles and on auscultation, normal breath sounds, no adventitious sounds and normal vocal resonance. Inspection Chest Wall - Normal. Back - normal.  Breast - Did not examine.  Cardiovascular Cardiovascular examination reveals -normal heart sounds, regular rate and rhythm with no murmurs and normal pedal pulses bilaterally.  Abdomen Inspection Inspection of the abdomen reveals - No Hernias. Skin - Scar - no surgical scars. Palpation/Percussion Palpation and  Percussion of the abdomen reveal - Soft, Non Tender, No Rebound tenderness, No Rigidity (guarding) and No hepatosplenomegaly. Auscultation Auscultation of the abdomen reveals - Bowel sounds normal.  Female Genitourinary Note: chaperone present - tanisha pt examined standing bulge with valsalva L groin no bulge with valsalva Rt groin i can't detect a femoral hernia  Peripheral Vascular Upper Extremity Palpation - Pulses bilaterally normal.  Neurologic Neurologic evaluation reveals -alert and oriented x 3 with no impairment of recent or remote memory. Mental Status-Normal.  Neuropsychiatric The patient's mood and affect are described as -normal. Judgment and Insight-insight is appropriate concerning matters relevant to self.  Musculoskeletal Normal Exam - Left-Upper Extremity Strength Normal and Lower Extremity Strength Normal. Normal Exam - Right-Upper Extremity Strength Normal and Lower Extremity Strength Normal.  Lymphatic Head & Neck  General Head & Neck Lymphatics: Bilateral - Description - Normal. Axillary - Did not examine. Femoral & Inguinal - Did not examine.    Assessment & Plan Randall Hiss M. Sumner Boesch MD; 06/15/2020 2:59 PM)  LEFT INGUINAL HERNIA (K40.90) Impression: We discussed the etiology of inguinal hernias. We discussed the signs & symptoms of incarceration & strangulation. We discussed non-operative and operative management. we discussed open and MIS approach (laparoscopic/robotic)  The patient has elected to proceed with robotic repair of left inguinal hernia with mesh   I described the procedure in detail. The patient was given Neurosurgeon. We discussed the risks and benefits including but not limited to bleeding, infection, chronic inguinal pain, nerve entrapment, hernia recurrence, mesh complications, hematoma formation, urinary retention, injury to the pelvic structures/bowel, numbness in the groin, blood clots, injury to the surrounding  structures, and anesthesia risk. We also discussed the typical post operative recovery course, including no heavy lifting for 4-6 weeks. I explained that the likelihood of improvement of their symptoms is good. she is going to finalize her OR date after she sees NSG. we did discuss that hernia repair wouldn't improve her L hip to knee pain  This patient encounter took 33 minutes on day of visit  to perform the following: take history, perform exam, review outside records, interpret imaging, counsel the patient on their diagnosis  Current Plans Pt Education - Pamphlet Given - Laparoscopic Hernia Repair: discussed with patient and provided information. You are being scheduled for surgery- Our schedulers will call you.  You should hear from our office's scheduling department within 5 working days about the location, date, and time of surgery. We try to make accommodations for patient's preferences in scheduling surgery, but sometimes the OR schedule or the surgeon's schedule prevents [place] from making those accommodations.  If you have not heard from our office [phone]) in 5 working days, call the office and ask for your surgeon's nurse.  If you have other questions about your diagnosis, plan, or surgery, call the office and ask for your surgeon's nurse.  Leighton Ruff. Redmond Pulling, MD, FACS General, Bariatric, & Minimally Invasive Surgery [facility], [place]

## 2020-06-18 DIAGNOSIS — M7062 Trochanteric bursitis, left hip: Secondary | ICD-10-CM | POA: Diagnosis not present

## 2020-06-18 DIAGNOSIS — M5416 Radiculopathy, lumbar region: Secondary | ICD-10-CM | POA: Diagnosis not present

## 2020-06-27 DIAGNOSIS — M25552 Pain in left hip: Secondary | ICD-10-CM | POA: Diagnosis not present

## 2020-06-27 DIAGNOSIS — M6281 Muscle weakness (generalized): Secondary | ICD-10-CM | POA: Diagnosis not present

## 2020-06-27 DIAGNOSIS — M7062 Trochanteric bursitis, left hip: Secondary | ICD-10-CM | POA: Diagnosis not present

## 2020-06-30 DIAGNOSIS — M5136 Other intervertebral disc degeneration, lumbar region: Secondary | ICD-10-CM | POA: Diagnosis not present

## 2020-06-30 DIAGNOSIS — M5416 Radiculopathy, lumbar region: Secondary | ICD-10-CM | POA: Diagnosis not present

## 2020-06-30 DIAGNOSIS — M7062 Trochanteric bursitis, left hip: Secondary | ICD-10-CM | POA: Diagnosis not present

## 2020-07-03 DIAGNOSIS — M25552 Pain in left hip: Secondary | ICD-10-CM | POA: Diagnosis not present

## 2020-07-03 DIAGNOSIS — M6281 Muscle weakness (generalized): Secondary | ICD-10-CM | POA: Diagnosis not present

## 2020-07-03 DIAGNOSIS — M7062 Trochanteric bursitis, left hip: Secondary | ICD-10-CM | POA: Diagnosis not present

## 2020-07-11 DIAGNOSIS — M25552 Pain in left hip: Secondary | ICD-10-CM | POA: Diagnosis not present

## 2020-07-11 DIAGNOSIS — M7062 Trochanteric bursitis, left hip: Secondary | ICD-10-CM | POA: Diagnosis not present

## 2020-07-11 DIAGNOSIS — M6281 Muscle weakness (generalized): Secondary | ICD-10-CM | POA: Diagnosis not present

## 2020-07-17 DIAGNOSIS — E039 Hypothyroidism, unspecified: Secondary | ICD-10-CM | POA: Diagnosis not present

## 2020-07-23 NOTE — Patient Instructions (Addendum)
DUE TO COVID-19 ONLY ONE VISITOR IS ALLOWED TO COME WITH YOU AND STAY IN THE WAITING ROOM ONLY DURING PRE OP AND PROCEDURE DAY OF SURGERY. THE 1 VISITOR  MAY VISIT WITH YOU AFTER SURGERY IN YOUR PRIVATE ROOM DURING VISITING HOURS ONLY!  YOU NEED TO HAVE A COVID 19 TEST ON_8/31______ @_1 :30______, THIS TEST MUST BE DONE BEFORE SURGERY,  COVID TESTING SITE [place] [id number], IT IS ON THE RIGHT GOING OUT [address] APPROXIMATELY  2 MINUTES PAST [employer] ON THE RIGHT. ONCE YOUR COVID TEST IS COMPLETED,  PLEASE BEGIN THE QUARANTINE INSTRUCTIONS AS OUTLINED IN YOUR HANDOUT.                [name]    Your procedure is scheduled on: 08/01/20   Report to [facility] Main  Entrance   Report to admitting at  10:45 AM     Call this number if you have problems the morning of surgery [phone]   . BRUSH YOUR TEETH MORNING OF SURGERY AND RINSE YOUR MOUTH OUT, NO CHEWING GUM CANDY OR MINTS.  No food after midnight.    You may have clear liquid until  9:30  AM.      CLEAR LIQUID DIET   Foods Allowed                                                                     Foods Excluded  Coffee and tea, regular and decaf                             liquids that you cannot  Plain Jell-O any favor except red or purple                                           see through such as: Fruit ices (not with fruit pulp)                                     milk, soups, orange juice  Iced Popsicles                                    All solid food Carbonated beverages, regular and diet                                    Cranberry, grape and apple juices Sports drinks like Gatorade Lightly seasoned clear broth or consume(fat free) Sugar, honey syrup      At 9:30 AM drink pre surgery drink.     Nothing by mouth after 9:30 AM.     Take these medicines the morning of surgery with A SIP OF WATER: Allegra, Synthroid  You may not have any metal on your body including hair pins and              piercings  Do not wear jewelry, make-up, lotions, powders or perfumes, deodorant             Do not wear nail polish on your fingernails.  Do not shave  48 hours prior to surgery.              Do not bring valuables to the hospital. [place].  Contacts, dentures or bridgework may not be worn into surgery.      Patients discharged the day of surgery will not be allowed to drive home.   IF YOU ARE HAVING SURGERY AND GOING HOME THE SAME DAY, YOU MUST HAVE AN ADULT TO DRIVE YOU HOME AND BE WITH YOU FOR 24 HOURS.   YOU MAY GO HOME BY TAXI OR UBER OR ORTHERWISE, BUT AN ADULT MUST ACCOMPANY YOU HOME AND STAY WITH YOU FOR 24 HOURS.  Name and phone number of your driver:  Special Instructions: N/A              Please read over the following fact sheets you were given: _____________________________________________________________________             [facility] - Preparing for Surgery  Before surgery, you can play an important role.   Because skin is not sterile, your skin needs to be as free of germs as possible.   You can reduce the number of germs on your skin by washing with CHG (chlorahexidine gluconate) soap before surgery.   CHG is an antiseptic cleaner which kills germs and bonds with the skin to continue killing germs even after washing. Please DO NOT use if you have an allergy to CHG or antibacterial soaps.   If your skin becomes reddened/irritated stop using the CHG and inform your nurse when you arrive at Short Stay. Do not shave (including legs and underarms) for at least 48 hours prior to the first CHG shower.   Please follow these instructions carefully:   1.  Shower with CHG Soap the night before surgery and the  morning of Surgery.  2.  If you choose to wash your hair, wash your hair first as usual with your  normal  shampoo.  3.  After you  shampoo, rinse your hair and body thoroughly to remove the  shampoo.                                        4.  Use CHG as you would any other liquid soap.  You can apply chg directly  to the skin and wash                       Gently with a scrungie or clean washcloth.  5.  Apply the CHG Soap to your body ONLY FROM THE NECK DOWN.   Do not use on face/ open                           Wound or open sores. Avoid contact with eyes, ears mouth and genitals (private parts).  Wash face,  Genitals (private parts) with your normal soap.             6.  Wash thoroughly, paying special attention to the area where your surgery  will be performed.  7.  Thoroughly rinse your body with warm water from the neck down.  8.  DO NOT shower/wash with your normal soap after using and rinsing off  the CHG Soap.             9.  Pat yourself dry with a clean towel.            10.  Wear clean pajamas.            11.  Place clean sheets on your bed the night of your first shower and do not  sleep with pets. Day of Surgery : Do not apply any lotions/deodorants the morning of surgery.  Please wear clean clothes to the hospital/surgery center.  FAILURE TO FOLLOW THESE INSTRUCTIONS MAY RESULT IN THE CANCELLATION OF YOUR SURGERY PATIENT SIGNATURE_________________________________  NURSE SIGNATURE__________________________________  ________________________________________________________________________

## 2020-07-24 ENCOUNTER — Encounter (HOSPITAL_COMMUNITY): Payer: Self-pay

## 2020-07-24 ENCOUNTER — Other Ambulatory Visit: Payer: Self-pay

## 2020-07-24 ENCOUNTER — Encounter (HOSPITAL_COMMUNITY)
Admission: RE | Admit: 2020-07-24 | Discharge: 2020-07-24 | Disposition: A | Discharge status: Home / Self Care | Payer: Medicare HMO | Source: Ambulatory Visit | Attending: General Surgery | Admitting: General Surgery

## 2020-07-24 DIAGNOSIS — M5136 Other intervertebral disc degeneration, lumbar region: Secondary | ICD-10-CM | POA: Diagnosis not present

## 2020-07-24 DIAGNOSIS — Z01812 Encounter for preprocedural laboratory examination: Secondary | ICD-10-CM | POA: Insufficient documentation

## 2020-07-24 HISTORY — DX: Hypothyroidism, unspecified: E03.9

## 2020-07-24 HISTORY — DX: Other complications of anesthesia, initial encounter: T88.59XA

## 2020-07-24 HISTORY — DX: Nausea with vomiting, unspecified: R11.2

## 2020-07-24 HISTORY — DX: Unspecified osteoarthritis, unspecified site: M19.90

## 2020-07-24 HISTORY — DX: Other specified postprocedural states: Z98.890

## 2020-07-24 LAB — CBC WITH DIFFERENTIAL/PLATELET
Abs Immature Granulocytes: 0.04 10*3/uL (ref 0.00–0.07)
Basophils Absolute: 0.1 10*3/uL (ref 0.0–0.1)
Basophils Relative: 1 %
Eosinophils Absolute: 0.1 10*3/uL (ref 0.0–0.5)
Eosinophils Relative: 1 %
HCT: 41.8 % (ref 36.0–46.0)
Hemoglobin: 13.9 g/dL (ref 12.0–15.0)
Immature Granulocytes: 0 %
Lymphocytes Relative: 22 %
Lymphs Abs: 2 10*3/uL (ref 0.7–4.0)
MCH: 31.9 pg (ref 26.0–34.0)
MCHC: 33.3 g/dL (ref 30.0–36.0)
MCV: 95.9 fL (ref 80.0–100.0)
Monocytes Absolute: 0.6 10*3/uL (ref 0.1–1.0)
Monocytes Relative: 7 %
Neutro Abs: 6.4 10*3/uL (ref 1.7–7.7)
Neutrophils Relative %: 69 %
Platelets: 260 10*3/uL (ref 150–400)
RBC: 4.36 MIL/uL (ref 3.87–5.11)
RDW: 12.6 % (ref 11.5–15.5)
WBC: 9.1 10*3/uL (ref 4.0–10.5)
nRBC: 0 % (ref 0.0–0.2)

## 2020-07-24 LAB — BASIC METABOLIC PANEL
Anion gap: 8 (ref 5–15)
BUN: 13 mg/dL (ref 8–23)
CO2: 28 mmol/L (ref 22–32)
Calcium: 9.1 mg/dL (ref 8.9–10.3)
Chloride: 102 mmol/L (ref 98–111)
Creatinine, Ser: 0.74 mg/dL (ref 0.44–1.00)
GFR calc Af Amer: >60 mL/min (ref >60)
GFR calc non Af Amer: >60 mL/min (ref >60)
Glucose, Bld: 90 mg/dL (ref 70–99)
Potassium: 4.2 mmol/L (ref 3.5–5.1)
Sodium: 138 mmol/L (ref 135–145)

## 2020-07-24 NOTE — Progress Notes (Signed)
COVID Vaccine Completed:Yes Date COVID Vaccine completed:02/26/20 COVID vaccine manufacturer:   Moderna     PCP - Dr. Earney Mallet Cardiologist - Dr. Ashok Norris  Chest x-ray - no EKG - 2016 Stress Test - 2016 ECHO -2016  Cardiac Cath - no  Sleep Study - no CPAP -   Fasting Blood Sugar - NA Checks Blood Sugar _____ times a day  Blood Thinner Instructions:NA Aspirin Instructions: Last Dose:  Anesthesia review:   Patient denies shortness of breath, fever, cough and chest pain at PAT appointment  yes   Patient verbalized understanding of instructions that were given to them at the PAT appointment. Patient was also instructed that they will need to review over the PAT instructions again at home before surgery.  Yes  Pt walks daily . She has no SOB climbing stairs or with ADLs. She had a cardiac work up in 2016 for chest pain EKG, ECHO, Stress tests were done and requested from [facility].

## 2020-07-29 ENCOUNTER — Other Ambulatory Visit (HOSPITAL_COMMUNITY): Payer: Medicare HMO

## 2020-07-29 ENCOUNTER — Other Ambulatory Visit (HOSPITAL_COMMUNITY)
Admission: RE | Admit: 2020-07-29 | Discharge: 2020-07-29 | Disposition: A | Discharge status: Home / Self Care | Payer: Medicare HMO | Source: Ambulatory Visit | Attending: General Surgery | Admitting: General Surgery

## 2020-07-29 DIAGNOSIS — Z01812 Encounter for preprocedural laboratory examination: Secondary | ICD-10-CM | POA: Insufficient documentation

## 2020-07-29 DIAGNOSIS — Z20822 Contact with and (suspected) exposure to covid-19: Secondary | ICD-10-CM | POA: Insufficient documentation

## 2020-07-29 LAB — SARS CORONAVIRUS 2 (TAT 6-24 HRS): SARS Coronavirus 2: NEGATIVE

## 2020-07-31 NOTE — Progress Notes (Signed)
Pt called with time change.

## 2020-08-01 ENCOUNTER — Ambulatory Visit (HOSPITAL_COMMUNITY): Payer: Medicare HMO | Admitting: Physician Assistant

## 2020-08-01 ENCOUNTER — Ambulatory Visit (HOSPITAL_COMMUNITY): Payer: Medicare HMO | Admitting: Certified Registered"

## 2020-08-01 ENCOUNTER — Ambulatory Visit (HOSPITAL_COMMUNITY)
Admission: RE | Admit: 2020-08-01 | Discharge: 2020-08-01 | Disposition: A | Discharge status: Home / Self Care | Payer: Medicare HMO | Attending: General Surgery | Admitting: General Surgery

## 2020-08-01 ENCOUNTER — Encounter (HOSPITAL_COMMUNITY): Payer: Self-pay | Admitting: General Surgery

## 2020-08-01 ENCOUNTER — Encounter (HOSPITAL_COMMUNITY)
Admission: RE | Disposition: A | Discharge status: Home / Self Care | Payer: Self-pay | Source: Home / Self Care | Attending: General Surgery

## 2020-08-01 DIAGNOSIS — M8949 Other hypertrophic osteoarthropathy, multiple sites: Secondary | ICD-10-CM | POA: Insufficient documentation

## 2020-08-01 DIAGNOSIS — Z87891 Personal history of nicotine dependence: Secondary | ICD-10-CM | POA: Diagnosis not present

## 2020-08-01 DIAGNOSIS — E039 Hypothyroidism, unspecified: Secondary | ICD-10-CM | POA: Diagnosis not present

## 2020-08-01 DIAGNOSIS — M1990 Unspecified osteoarthritis, unspecified site: Secondary | ICD-10-CM | POA: Diagnosis not present

## 2020-08-01 DIAGNOSIS — K4090 Unilateral inguinal hernia, without obstruction or gangrene, not specified as recurrent: Secondary | ICD-10-CM | POA: Diagnosis not present

## 2020-08-01 HISTORY — PX: XI ROBOTIC ASSISTED INGUINAL HERNIA REPAIR WITH MESH: SHX6706

## 2020-08-01 SURGERY — REPAIR, HERNIA, INGUINAL, ROBOT-ASSISTED, LAPAROSCOPIC, USING MESH
Anesthesia: General | Site: Abdomen

## 2020-08-01 MED ORDER — 0.9 % SODIUM CHLORIDE (POUR BTL) OPTIME
TOPICAL | Status: DC | PRN
  Administered 2020-08-01: 1000 mL

## 2020-08-01 MED ORDER — BUPIVACAINE LIPOSOME 1.3 % IJ SUSP
20.0000 mL | Freq: Once | INTRAMUSCULAR | Status: DC
  Filled 2020-08-01: qty 20

## 2020-08-01 MED ORDER — LACTATED RINGERS IV SOLN: INTRAVENOUS | Status: DC

## 2020-08-01 MED ORDER — ONDANSETRON HCL 4 MG/2ML IJ SOLN
INTRAMUSCULAR | Status: AC
  Filled 2020-08-01: qty 2

## 2020-08-01 MED ORDER — MIDAZOLAM HCL 2 MG/2ML IJ SOLN
INTRAMUSCULAR | Status: AC
  Filled 2020-08-01: qty 2

## 2020-08-01 MED ORDER — KETOROLAC TROMETHAMINE 30 MG/ML IJ SOLN
INTRAMUSCULAR | Status: AC
  Filled 2020-08-01: qty 1

## 2020-08-01 MED ORDER — MIDAZOLAM HCL 2 MG/2ML IJ SOLN
INTRAMUSCULAR | Status: DC | PRN
  Administered 2020-08-01: 2 mg via INTRAVENOUS

## 2020-08-01 MED ORDER — PROPOFOL 10 MG/ML IV BOLUS
INTRAVENOUS | Status: DC | PRN
  Administered 2020-08-01: 130 mg via INTRAVENOUS

## 2020-08-01 MED ORDER — SUGAMMADEX SODIUM 200 MG/2ML IV SOLN
INTRAVENOUS | Status: DC | PRN
  Administered 2020-08-01: 140 mg via INTRAVENOUS

## 2020-08-01 MED ORDER — SCOPOLAMINE 1 MG/3DAYS TD PT72: 1.0000 | MEDICATED_PATCH | Freq: Once | TRANSDERMAL | Status: DC

## 2020-08-01 MED ORDER — BUPIVACAINE LIPOSOME 1.3 % IJ SUSP
INTRAMUSCULAR | Status: DC | PRN
  Administered 2020-08-01: 20 mL

## 2020-08-01 MED ORDER — PROPOFOL 10 MG/ML IV BOLUS
INTRAVENOUS | Status: AC
  Filled 2020-08-01: qty 20

## 2020-08-01 MED ORDER — LIDOCAINE 2% (20 MG/ML) 5 ML SYRINGE
INTRAMUSCULAR | Status: DC | PRN
  Administered 2020-08-01: 60 mg via INTRAVENOUS

## 2020-08-01 MED ORDER — FENTANYL CITRATE (PF) 100 MCG/2ML IJ SOLN
INTRAMUSCULAR | Status: AC
  Filled 2020-08-01: qty 2

## 2020-08-01 MED ORDER — EPHEDRINE SULFATE-NACL 50-0.9 MG/10ML-% IV SOSY
PREFILLED_SYRINGE | INTRAVENOUS | Status: DC | PRN
  Administered 2020-08-01: 5 mg via INTRAVENOUS
  Administered 2020-08-01: 10 mg via INTRAVENOUS

## 2020-08-01 MED ORDER — CHLORHEXIDINE GLUCONATE CLOTH 2 % EX PADS: 6.0000 | MEDICATED_PAD | Freq: Once | CUTANEOUS | Status: DC

## 2020-08-01 MED ORDER — LIDOCAINE 2% (20 MG/ML) 5 ML SYRINGE
INTRAMUSCULAR | Status: AC
  Filled 2020-08-01: qty 5

## 2020-08-01 MED ORDER — DEXAMETHASONE SODIUM PHOSPHATE 10 MG/ML IJ SOLN
INTRAMUSCULAR | Status: DC | PRN
  Administered 2020-08-01: 10 mg via INTRAVENOUS

## 2020-08-01 MED ORDER — PHENYLEPHRINE 40 MCG/ML (10ML) SYRINGE FOR IV PUSH (FOR BLOOD PRESSURE SUPPORT)
PREFILLED_SYRINGE | INTRAVENOUS | Status: AC
  Filled 2020-08-01: qty 10

## 2020-08-01 MED ORDER — DEXAMETHASONE SODIUM PHOSPHATE 4 MG/ML IJ SOLN: 4.0000 mg | INTRAMUSCULAR | Status: DC

## 2020-08-01 MED ORDER — GABAPENTIN 100 MG PO CAPS
200.0000 mg | ORAL_CAPSULE | ORAL | Status: AC
  Administered 2020-08-01: 200 mg via ORAL
  Filled 2020-08-01: qty 2

## 2020-08-01 MED ORDER — PHENYLEPHRINE 40 MCG/ML (10ML) SYRINGE FOR IV PUSH (FOR BLOOD PRESSURE SUPPORT)
PREFILLED_SYRINGE | INTRAVENOUS | Status: DC | PRN
  Administered 2020-08-01: 120 ug via INTRAVENOUS
  Administered 2020-08-01: 80 ug via INTRAVENOUS

## 2020-08-01 MED ORDER — ORAL CARE MOUTH RINSE: 15.0000 mL | Freq: Once | OROMUCOSAL | Status: AC

## 2020-08-01 MED ORDER — FENTANYL CITRATE (PF) 100 MCG/2ML IJ SOLN: 25.0000 ug | INTRAMUSCULAR | Status: DC | PRN

## 2020-08-01 MED ORDER — CHLORHEXIDINE GLUCONATE 0.12 % MT SOLN
15.0000 mL | Freq: Once | OROMUCOSAL | Status: AC
  Administered 2020-08-01: 15 mL via OROMUCOSAL

## 2020-08-01 MED ORDER — ROCURONIUM BROMIDE 10 MG/ML (PF) SYRINGE
PREFILLED_SYRINGE | INTRAVENOUS | Status: DC | PRN
  Administered 2020-08-01: 60 mg via INTRAVENOUS
  Administered 2020-08-01: 15 mg via INTRAVENOUS

## 2020-08-01 MED ORDER — EPHEDRINE 5 MG/ML INJ
INTRAVENOUS | Status: AC
  Filled 2020-08-01: qty 10

## 2020-08-01 MED ORDER — SCOPOLAMINE 1 MG/3DAYS TD PT72
1.0000 | MEDICATED_PATCH | TRANSDERMAL | Status: DC
  Administered 2020-08-01: 1.5 mg via TRANSDERMAL
  Filled 2020-08-01: qty 1

## 2020-08-01 MED ORDER — CEFAZOLIN SODIUM-DEXTROSE 2-4 GM/100ML-% IV SOLN
2.0000 g | INTRAVENOUS | Status: AC
  Administered 2020-08-01: 2 g via INTRAVENOUS
  Filled 2020-08-01: qty 100

## 2020-08-01 MED ORDER — ROCURONIUM BROMIDE 10 MG/ML (PF) SYRINGE
PREFILLED_SYRINGE | INTRAVENOUS | Status: AC
  Filled 2020-08-01: qty 10

## 2020-08-01 MED ORDER — ONDANSETRON HCL 4 MG/2ML IJ SOLN
INTRAMUSCULAR | Status: DC | PRN
  Administered 2020-08-01: 4 mg via INTRAVENOUS

## 2020-08-01 MED ORDER — BUPIVACAINE-EPINEPHRINE (PF) 0.25% -1:200000 IJ SOLN
INTRAMUSCULAR | Status: DC | PRN
  Administered 2020-08-01: 30 mL

## 2020-08-01 MED ORDER — KETOROLAC TROMETHAMINE 15 MG/ML IJ SOLN
INTRAMUSCULAR | Status: DC | PRN
  Administered 2020-08-01: 15 mg via INTRAVENOUS

## 2020-08-01 MED ORDER — ACETAMINOPHEN 500 MG PO TABS
1000.0000 mg | ORAL_TABLET | ORAL | Status: AC
  Administered 2020-08-01: 1000 mg via ORAL
  Filled 2020-08-01: qty 2

## 2020-08-01 MED ORDER — ACETAMINOPHEN 500 MG PO TABS: 1000.0000 mg | ORAL_TABLET | Freq: Three times a day (TID) | ORAL | 0 refills | Status: AC

## 2020-08-01 MED ORDER — ACETAMINOPHEN 500 MG PO TABS: 1000.0000 mg | ORAL_TABLET | Freq: Once | ORAL | Status: DC

## 2020-08-01 MED ORDER — DEXAMETHASONE SODIUM PHOSPHATE 10 MG/ML IJ SOLN
INTRAMUSCULAR | Status: AC
  Filled 2020-08-01: qty 1

## 2020-08-01 MED ORDER — BUPIVACAINE-EPINEPHRINE 0.25% -1:200000 IJ SOLN
INTRAMUSCULAR | Status: AC
  Filled 2020-08-01: qty 1

## 2020-08-01 MED ORDER — TRAMADOL HCL 50 MG PO TABS: 50.0000 mg | ORAL_TABLET | Freq: Four times a day (QID) | ORAL | 0 refills | Status: DC | PRN

## 2020-08-01 MED ORDER — FENTANYL CITRATE (PF) 100 MCG/2ML IJ SOLN
INTRAMUSCULAR | Status: DC | PRN
Start: 2020-08-01 — End: 2020-08-01
  Administered 2020-08-01 (×2): 50 ug via INTRAVENOUS

## 2020-08-01 SURGICAL SUPPLY — 55 items
BLADE SURG SZ11 CARB STEEL (BLADE) ×2 IMPLANT
CANNULA REDUC XI 12-8 STAPL (CANNULA)
CANNULA REDUCER 12-8 DVNC XI (CANNULA) IMPLANT
CHLORAPREP W/TINT 26 (MISCELLANEOUS) ×2 IMPLANT
CLSR STERI-STRIP ANTIMIC 1/2X4 (GAUZE/BANDAGES/DRESSINGS) ×2 IMPLANT
COVER SURGICAL LIGHT HANDLE (MISCELLANEOUS) ×2 IMPLANT
COVER TIP SHEARS 8 DVNC (MISCELLANEOUS) IMPLANT
COVER TIP SHEARS 8MM DA VINCI (MISCELLANEOUS)
COVER WAND RF STERILE (DRAPES) IMPLANT
DECANTER SPIKE VIAL GLASS SM (MISCELLANEOUS) ×2 IMPLANT
DRAPE ARM DVNC X/XI (DISPOSABLE) ×4 IMPLANT
DRAPE COLUMN DVNC XI (DISPOSABLE) ×1 IMPLANT
DRAPE DA VINCI XI ARM (DISPOSABLE) ×8
DRAPE DA VINCI XI COLUMN (DISPOSABLE) ×2
DRSG TEGADERM 2-3/8X2-3/4 SM (GAUZE/BANDAGES/DRESSINGS) ×2 IMPLANT
ELECT REM PT RETURN 15FT ADLT (MISCELLANEOUS) ×2 IMPLANT
GAUZE SPONGE 2X2 8PLY STRL LF (GAUZE/BANDAGES/DRESSINGS) ×1 IMPLANT
GAUZE SPONGE 4X4 12PLY STRL (GAUZE/BANDAGES/DRESSINGS) ×2 IMPLANT
GLOVE BIO SURGEON STRL SZ7.5 (GLOVE) ×4 IMPLANT
GLOVE INDICATOR 8.0 STRL GRN (GLOVE) ×4 IMPLANT
GOWN STRL REUS W/TWL XL LVL3 (GOWN DISPOSABLE) ×4 IMPLANT
GRASPER SUT TROCAR 14GX15 (MISCELLANEOUS) ×2 IMPLANT
KIT BASIN OR (CUSTOM PROCEDURE TRAY) ×2 IMPLANT
KIT TURNOVER KIT A (KITS) IMPLANT
MESH 3DMAX 4X6 LT LRG (Mesh General) ×2 IMPLANT
NEEDLE HYPO 22GX1.5 SAFETY (NEEDLE) ×2 IMPLANT
NEEDLE INSUFFLATION 14GA 120MM (NEEDLE) IMPLANT
PACK CARDIOVASCULAR III (CUSTOM PROCEDURE TRAY) ×2 IMPLANT
PAD POSITIONING PINK XL (MISCELLANEOUS) ×2 IMPLANT
SCISSORS LAP 5X35 DISP (ENDOMECHANICALS) IMPLANT
SEAL CANN UNIV 5-8 DVNC XI (MISCELLANEOUS) ×3 IMPLANT
SEAL XI 5MM-8MM UNIVERSAL (MISCELLANEOUS) ×6
SET IRRIG TUBING LAPAROSCOPIC (IRRIGATION / IRRIGATOR) IMPLANT
SOL ANTI FOG 6CC (MISCELLANEOUS) ×1 IMPLANT
SOLUTION ANTI FOG 6CC (MISCELLANEOUS) ×1
SOLUTION ELECTROLUBE (MISCELLANEOUS) ×2 IMPLANT
SPONGE GAUZE 2X2 STER 10/PKG (GAUZE/BANDAGES/DRESSINGS) ×1
SPONGE LAP 18X18 RF (DISPOSABLE) ×2 IMPLANT
STAPLER CANNULA SEAL DVNC XI (STAPLE) IMPLANT
STAPLER CANNULA SEAL XI (STAPLE)
STRIP CLOSURE SKIN 1/2X4 (GAUZE/BANDAGES/DRESSINGS) ×2 IMPLANT
SUT MNCRL AB 4-0 PS2 18 (SUTURE) ×2 IMPLANT
SUT VIC AB 0 UR5 27 (SUTURE) ×4 IMPLANT
SUT VIC AB 3-0 SH 27 (SUTURE) ×6
SUT VIC AB 3-0 SH 27XBRD (SUTURE) ×3 IMPLANT
SUT VICRYL 0 UR6 27IN ABS (SUTURE) ×2 IMPLANT
SUT VLOC 180 2-0 6IN GS21 (SUTURE) IMPLANT
SUT VLOC 180 2-0 9IN GS21 (SUTURE) IMPLANT
SUT VLOC 180 3-0 9IN GS21 (SUTURE) ×4 IMPLANT
SYR 10ML ECCENTRIC (SYRINGE) IMPLANT
SYR 20ML LL LF (SYRINGE) ×2 IMPLANT
TOWEL OR 17X26 10 PK STRL BLUE (TOWEL DISPOSABLE) ×2 IMPLANT
TOWEL OR NON WOVEN STRL DISP B (DISPOSABLE) ×2 IMPLANT
TROCAR BLADELESS OPT 5 100 (ENDOMECHANICALS) IMPLANT
TUBING INSUFFLATION 10FT LAP (TUBING) ×2 IMPLANT

## 2020-08-01 NOTE — Op Note (Signed)
PREOPERATIVE DIAGNOSIS: Left inguinal hernia.    POSTOPERATIVE DIAGNOSIS: Left  indirect inguinal hernia   PROCEDURE: Robotic/XI repair of Left indirect inguinal hernia with  mesh (rTAPP).  Laparoscopic bilateral TAP block   SURGEON: Leighton Ruff. Redmond Pulling, MD FACS   ASSISTANT SURGEON: Daiva Huge MD (resident)    ANESTHESIA: General plus local consisting of 0.25% Marcaine with epi mixed with exparel   ESTIMATED BLOOD LOSS: Minimal.    FINDINGS: The patient had a left indirect hernia.  It was repaired using Bard 3dmax large left mesh    SPECIMEN: cord lipoma - discarded   INDICATIONS FOR PROCEDURE: 69yo presented for repair of a symptomatic inguinal hernia. The risks and benefits including but not limited to bleeding, infection, chronic inguinal pain, nerve entrapment, hernia recurrence, mesh complications, hematoma formation, urinary retention, injury to the testicles or the ovaries, numbness in the groin, blood clots, injury to the surrounding structures, and anesthesia risk was discussed with the patient.   DESCRIPTION OF PROCEDURE: The operative site was marked with patient confirming the location in preop. After obtaining informed consent the patient was then taken back to the operating room, placed  supine on the operating room table. General endotracheal anesthesia was  established. The patient had emptied their bladder prior to going back to  the operating room. Sequential compression devices were placed. The  abdomen and groin were prepped and draped in the usual standard surgical  fashion with ChloraPrep. The patient received oral Tylenol/gabapentin preoperatively as well as IV  antibiotics prior to the incision. A surgical time-out was performed.  Local was infiltrated at the base of the umbilicus.    Next, a 1-cm vertical supraumbilical incision was made with a #11 blade. The fascia  was grasped and lifted anteriorly. Next, the fascia was incised, and  the abdominal cavity was  entered. Pursestring suture was placed around  the fascial edges using a 0 Vicryl. A 12-mm Hasson trocar was placed.  Pneumoperitoneum was smoothly established up to a patient pressure of 15  mmHg. Robotic Laparoscope was advanced.  There was no evidence of injury to surrounding structures.  Patient had a left indirect inguinal hernia.  A laparoscopic bilateral tap block was performed by myself and the resident.      Two additional 15mm robotic trochars were placed one on patient's left and one on patient's right in the midclavicular line about 2 cm above the supraumbilical trocar.  I then placed a 8 mm trocar through the [facility] trocar.   We then deployed the robot for pelvic surgery from patient's right.  The robot was docked.  Robotic laparoscope was placed through the supraumbilical trocar and the anatomy was targeted.  The other arms were then connected to the trochars.  A pair of MCS scissors was placed through the right trocar and a fenestrated bipolar through the left trocar all under direct visualization.  I then scrubbed out and went to the robotic console.    I then made incision along the peritoneum on the left, starting 2 inches above the anterior superior iliac spine and caring it medial toward the median umbilical ligament in a lazy S configuration using MCS scissors with electrocautery. The peritoneal flap was then gently dissected downward from the anterior abdominal wall taking care not to  injure the inferior epigastric vessels. The pubic bone  & copper's ligament were identified. The round ligament was identified.  Using traction and counter traction, I reduced the indirect sac in  its entirety. the hernia sac  was stripped from surrounding structures.    I divided the round ligament about 2 cm proximal to the deep inguinal ring with scissors with cautery. I then went about creating a large pocket by lifting the peritoneum of the pelvic floor. I took great care not to injure the iliac  vessels.     The dissection had extended about 2 cm below the pubic bone.  The external iliac vein was identified.  There is no femoral hernia.  There is no obturator hernia.    A large piece of left Bard 3D max mesh were placed through the [facility] trocar into the abdominal cavity. The resident went ahead and placed a 3-0 Vicryl suture on SH into the abdomen off to the side & one 3-0 absorbable V-Loc suture through the [facility] trocar into the abdomen off to the side. The robotic trocar & camera was then placed back into the Hasson trocar.   I then obtained the previously placed piece of Bard large 3D left max mesh for the left groin and placed it into the inguinal area.   half of it covered medial  to the inferior epigastric vessels and half of it lateral to the  inferior epigastric vessels. The defect was well  covered with the mesh.  I then secured the mesh to Cooper's ligament with an interrupted 3-0 Vicryl suture.  I placed an additional suture superior medially along the edge of the mesh medial to the inferior gastric vessel.  I placed a 3 Vicryl suture laterally along the superior lateral edge of the mesh lateral to the inferior epigastric vessels.  I then closed the peritoneal flap with a running 3-0  V-Loc after reducing intra-abdominal pressure to 63mmHg.  The mesh was well covered.  There is no defect in the peritoneal flap.  The mesh was flat.  It had not curled up.  I scrubbed back in.  The surgical robot was undocked and moved away from the OR table.  The 2 needles were removed from the abdomen along with excess suture.      I removed the Hasson trocar and tied down the previously placed pursestring suture however it broke.  I placed 2 interrupted 0 Vicryl sutures on a UR 5 needle to reapproximate the umbilical fascia.  The closure was viewed laparoscopically.  I did place 1 additional 0 Vicryl suture using a PMI suture passer with laparoscopic guidance.  There was no evidence of fascial  defect. There was no air leak at the umbilicus. There was no  evidence of injury to surrounding structures. Pneumoperitoneum was released, and the remaining trocars were removed. All skin incisions  were closed with a 4-0 Monocryl in a subcuticular fashion followed by application of Steri-Strips and sterile bandages. All needle, instrument, and sponge counts  were correct x2.   There are no immediate complications. The patient tolerated the procedure well. The patient was extubated and taken to the  recovery room in stable condition.  I was present during the entire case and performed all key portions of the procedure including robotic portion of the procedure  Leighton Ruff. Redmond Pulling, MD, FACS General, Bariatric, & Minimally Invasive Surgery [facility], [place]

## 2020-08-01 NOTE — Discharge Instructions (Signed)
[name], P.A. LAPAROSCOPIC SURGERY: POST OP INSTRUCTIONS Always review your discharge instruction sheet given to you by the facility where your surgery was performed. IF YOU HAVE DISABILITY OR FAMILY LEAVE FORMS, YOU MUST BRING THEM TO THE OFFICE FOR PROCESSING.   DO NOT GIVE THEM TO YOUR DOCTOR.  PAIN CONTROL  1. First take acetaminophen (Tylenol) AND/or ibuprofen (Advil) to control your pain after surgery.  Follow directions on package.  Taking acetaminophen (Tylenol) and/or ibuprofen (Advil) regularly after surgery will help to control your pain and lower the amount of prescription pain medication you may need.  You should not take more than 3,000 mg (3 grams) of acetaminophen (Tylenol) in 24 hours.  You should not take ibuprofen (Advil), aleve, motrin, naprosyn or other NSAIDS if you have a history of stomach ulcers or chronic kidney disease.  2. A prescription for pain medication may be given to you upon discharge.  Take your pain medication as prescribed, if you still have uncontrolled pain after taking acetaminophen (Tylenol) or ibuprofen (Advil). 3. Use ice packs to help control pain. 4. If you need a refill on your pain medication, please contact your pharmacy.  They will contact our office to request authorization. Prescriptions will not be filled after 5pm or on week-ends.  HOME MEDICATIONS 5. Take your usually prescribed medications unless otherwise directed.  DIET 6. You should follow a light diet the first few days after arrival home.  Be sure to include lots of fluids daily. Avoid fatty, fried foods.   CONSTIPATION 7. It is common to experience some constipation after surgery and if you are taking pain medication.  Increasing fluid intake and taking a stool softener (such as Colace) will usually help or prevent this problem from occurring.  A mild laxative (Milk of Magnesia or Miralax) should be taken according to package instructions if there are no bowel  movements after 48 hours.  WOUND/INCISION CARE 8. Most patients will experience some swelling and bruising in the area of the incisions.  Ice packs will help.  Swelling and bruising can take several days to resolve.  9. Unless discharge instructions indicate otherwise, follow guidelines below  a. STERI-STRIPS - you may remove your outer bandages 48 hours after surgery, and you may shower at that time.  You have steri-strips (small skin tapes) in place directly over the incision.  These strips should be left on the skin for 7-10 days.   b. DERMABOND/SKIN GLUE - you may shower in 24 hours.  The glue will flake off over the next 2-3 weeks. 10. Any sutures or staples will be removed at the office during your follow-up visit.  ACTIVITIES 11. You may resume regular (light) daily activities beginning the next day--such as daily self-care, walking, climbing stairs--gradually increasing activities as tolerated.  You may have sexual intercourse when it is comfortable.  Refrain from any heavy lifting or straining until approved by your doctor. a. You may drive when you are no longer taking prescription pain medication, you can comfortably wear a seatbelt, and you can safely maneuver your car and apply brakes.  FOLLOW-UP 12. You should see your doctor in the office for a follow-up appointment approximately 2-3 weeks after your surgery.  You should have been given your post-op/follow-up appointment when your surgery was scheduled.  If you did not receive a post-op/follow-up appointment, make sure that you call for this appointment within a day or two after you arrive home to insure a convenient appointment time.  OTHER  INSTRUCTIONS 13. DO NOT LIFT/PUSH/PULL ANYTHING GREATER THAN 10 LBS FOR 4 WEEKS  WHEN TO CALL YOUR DOCTOR: 1. Fever over 101.0 2. Inability to urinate 3. Continued bleeding from incision. 4. Increased pain, redness, or drainage from the incision. 5. Increasing abdominal pain  The clinic  staff is available to answer your questions during regular business hours.  Please don't hesitate to call and ask to speak to one of the nurses for clinical concerns.  If you have a medical emergency, go to the nearest emergency room or call 911.  A surgeon from [facility] is always on call at the hospital. [address], [place], [place], [place]  [address] ? [address] [place], [place], [place]   [address] [phone] ? [phone] ? FAX [phone] Web site: [email or web address]  ........Marland Kitchen   Managing Your Pain After Surgery Without Opioids    Thank you for participating in our program to help patients manage their pain after surgery without opioids. This is part of our effort to provide you with the best care possible, without exposing you or your family to the risk that opioids pose.  What pain can I expect after surgery? You can expect to have some pain after surgery. This is normal. The pain is typically worse the day after surgery, and quickly begins to get better. Many studies have found that many patients are able to manage their pain after surgery with Over-the-Counter (OTC) medications such as Tylenol and Motrin. If you have a condition that does not allow you to take Tylenol or Motrin, notify your surgical team.  How will I manage my pain? The best strategy for controlling your pain after surgery is around the clock pain control with Tylenol (acetaminophen) and Motrin (ibuprofen or Advil). Alternating these medications with each other allows you to maximize your pain control. In addition to Tylenol and Motrin, you can use heating pads or ice packs on your incisions to help reduce your pain.  How will I alternate your regular strength over-the-counter pain medication? You will take a dose of pain medication every three hours. ; Start by taking 650 mg of Tylenol (2 pills of 325 mg) ; 3 hours later take 600 mg of Motrin (3 pills of 200 mg) ; 3 hours  after taking the Motrin take 650 mg of Tylenol ; 3 hours after that take 600 mg of Motrin.   - 1 -  See example - if your first dose of Tylenol is at 12:00 PM   12:00 PM Tylenol 650 mg (2 pills of 325 mg)  3:00 PM Motrin 600 mg (3 pills of 200 mg)  6:00 PM Tylenol 650 mg (2 pills of 325 mg)  9:00 PM Motrin 600 mg (3 pills of 200 mg)  Continue alternating every 3 hours   We recommend that you follow this schedule around-the-clock for at least 3 days after surgery, or until you feel that it is no longer needed. Use the table on the last page of this handout to keep track of the medications you are taking. Important: Do not take more than 3000mg  of Tylenol or 1800mg  of Motrin in a 24-hour period. Do not take ibuprofen/Motrin if you have a history of bleeding stomach ulcers, severe kidney disease, &/or actively taking a blood thinner  What if I still have pain? If you have pain that is not controlled with the over-the-counter pain medications (Tylenol and Motrin or Advil) you might have what we call "breakthrough" pain. You will  receive a prescription for a small amount of an opioid pain medication such as Oxycodone, Tramadol, or Tylenol with Codeine. Use these opioid pills in the first 24 hours after surgery if you have breakthrough pain. Do not take more than 1 pill every 4-6 hours.  If you still have uncontrolled pain after using all opioid pills, don't hesitate to call our staff using the number provided. We will help make sure you are managing your pain in the best way possible, and if necessary, we can provide a prescription for additional pain medication.   Day 1    Time  Name of Medication Number of pills taken  Amount of Acetaminophen  Pain Level   Comments  AM PM       AM PM       AM PM       AM PM       AM PM       AM PM       AM PM       AM PM       Total Daily amount of Acetaminophen Do not take more than  3,000 mg per day      Day 2    Time  Name of  Medication Number of pills taken  Amount of Acetaminophen  Pain Level   Comments  AM PM       AM PM       AM PM       AM PM       AM PM       AM PM       AM PM       AM PM       Total Daily amount of Acetaminophen Do not take more than  3,000 mg per day      Day 3    Time  Name of Medication Number of pills taken  Amount of Acetaminophen  Pain Level   Comments  AM PM       AM PM       AM PM       AM PM          AM PM       AM PM       AM PM       AM PM       Total Daily amount of Acetaminophen Do not take more than  3,000 mg per day      Day 4    Time  Name of Medication Number of pills taken  Amount of Acetaminophen  Pain Level   Comments  AM PM       AM PM       AM PM       AM PM       AM PM       AM PM       AM PM       AM PM       Total Daily amount of Acetaminophen Do not take more than  3,000 mg per day      Day 5    Time  Name of Medication Number of pills taken  Amount of Acetaminophen  Pain Level   Comments  AM PM       AM PM       AM PM       AM PM       AM PM  AM PM       AM PM       AM PM       Total Daily amount of Acetaminophen Do not take more than  3,000 mg per day       Day 6    Time  Name of Medication Number of pills taken  Amount of Acetaminophen  Pain Level  Comments  AM PM       AM PM       AM PM       AM PM       AM PM       AM PM       AM PM       AM PM       Total Daily amount of Acetaminophen Do not take more than  3,000 mg per day      Day 7    Time  Name of Medication Number of pills taken  Amount of Acetaminophen  Pain Level   Comments  AM PM       AM PM       AM PM       AM PM       AM PM       AM PM       AM PM       AM PM       Total Daily amount of Acetaminophen Do not take more than  3,000 mg per day        For additional information about how and where to safely dispose of unused opioid medications - [employer]  Disclaimer: This  document contains information and/or instructional materials adapted from [place] for the typical patient with your condition. It does not replace medical advice from your health care provider because your experience may differ from that of the typical patient. Talk to your health care provider if you have any questions about this document, your condition or your treatment plan. Adapted from [facility], Adult, Care After This sheet gives you information about how to care for yourself after your procedure. Your health care provider may also give you more specific instructions. If you have problems or questions, contact your health care provider. What can I expect after the procedure? After the procedure, the following side effects are common:  Pain or discomfort at the IV site.  Nausea.  Vomiting.  Sore throat.  Trouble concentrating.  Feeling cold or chills.  Weak or tired.  Sleepiness and fatigue.  Soreness and body aches. These side effects can affect parts of the body that were not involved in surgery. Follow these instructions at home:  For at least 24 hours after the procedure:  Have a responsible adult stay with you. It is important to have someone help care for you until you are awake and alert.  Rest as needed.  Do not: ? Participate in activities in which you could fall or become injured. ? Drive. ? Use heavy machinery. ? Drink alcohol. ? Take sleeping pills or medicines that cause drowsiness. ? Make important decisions or sign legal documents. ? Take care of children on your own. Eating and drinking  Follow any instructions from your health care provider about eating or drinking restrictions.  When you feel hungry, start by eating small amounts of foods that are soft and easy to digest (bland), such as  toast. Gradually return to your regular diet.  Drink enough fluid to keep your urine pale  yellow.  If you vomit, rehydrate by drinking water, juice, or clear broth. General instructions  If you have sleep apnea, surgery and certain medicines can increase your risk for breathing problems. Follow instructions from your health care provider about wearing your sleep device: ? Anytime you are sleeping, including during daytime naps. ? While taking prescription pain medicines, sleeping medicines, or medicines that make you drowsy.  Return to your normal activities as told by your health care provider. Ask your health care provider what activities are safe for you.  Take over-the-counter and prescription medicines only as told by your health care provider.  If you smoke, do not smoke without supervision.  Keep all follow-up visits as told by your health care provider. This is important. Contact a health care provider if:  You have nausea or vomiting that does not get better with medicine.  You cannot eat or drink without vomiting.  You have pain that does not get better with medicine.  You are unable to pass urine.  You develop a skin rash.  You have a fever.  You have redness around your IV site that gets worse. Get help right away if:  You have difficulty breathing.  You have chest pain.  You have blood in your urine or stool, or you vomit blood. Summary  After the procedure, it is common to have a sore throat or nausea. It is also common to feel tired.  Have a responsible adult stay with you for the first 24 hours after general anesthesia. It is important to have someone help care for you until you are awake and alert.  When you feel hungry, start by eating small amounts of foods that are soft and easy to digest (bland), such as toast. Gradually return to your regular diet.  Drink enough fluid to keep your urine pale yellow.  Return to your normal activities as told by your health care provider. Ask your health care provider what activities are safe for  you. This information is not intended to replace advice given to you by your health care provider. Make sure you discuss any questions you have with your health care provider. Document Revised: 11/18/2017 Document Reviewed: 07/01/2017 Elsevier Patient Education  [place].

## 2020-08-01 NOTE — Transfer of Care (Signed)
Immediate Anesthesia Transfer of Care Note  Patient: [name]  Procedure(s) Performed: ROBOTIC LEFT INGUINAL HERNIA REPAIR WITH MESH (N/A Abdomen)  Patient Location: PACU  Anesthesia Type:General  Level of Consciousness: awake, alert  and oriented  Airway & Oxygen Therapy: Patient Spontanous Breathing and Patient connected to face mask oxygen  Post-op Assessment: Report given to RN, Post -op Vital signs reviewed and stable and Patient moving all extremities X 4  Post vital signs: Reviewed and stable  Last Vitals:  Vitals Value Taken Time  BP    Temp    Pulse 77 08/01/20 1215  Resp 12 08/01/20 1215  SpO2 100 % 08/01/20 1215  Vitals shown include unvalidated device data.  Last Pain:  Vitals:   08/01/20 0759  TempSrc:   PainSc: 0-No pain         Complications: No complications documented.

## 2020-08-01 NOTE — H&P (Signed)
CC: here for surgery  Requesting provider:   HPI: [name] is an 69 y.o. female who is here for robotic left inguinal hernia repair with mesh. No changes since seen in clinic.   The patient is a 69 year old female who presents with an inguinal hernia. She is referred by Mariah Leriche PA For evaluation of a left inguinal hernia. The patient states about 4 months ago she noticed a lump in her left groin. She initially thought it was a cyst but it was persistent so she saw her primary care team which ordered an ultrasound which I reviewed. She underwent an ultrasound on June 21 which was consistent with a left inguinal hernia. I also reviewed the PCPs note. She states that she has had a partial hysterectomy through her vagina because of a remote history of cervical cancer. The bulge comes and goes. She also reports that she has had a cystocele and rectocele repair. She is currently having a lot of left hip issues and pain. It is causing difficulty walking. She has had an MRI of her back and has follow-up with neurosurgery next week. She has pain from her left hip to her left knee. She feels that there is a knife going through her hip. She denies any burning, shooting or stabbing pain in her groin. No tobacco use. No chest pain, chest pressure, shortness of breath, orthopnea, dyspnea exertion, TIAs or amaurosis fugax. The hernia has not been hard or firm  Past Medical History:  Diagnosis Date  . Anginal pain ([name]) 2016   Due to Job stress  . Arthritis    Back, hips, knees, hands  . Cancer ([name]) 1977   cervical  . Complication of anesthesia   . Hypothyroidism   . PONV (postoperative nausea and vomiting)     Past Surgical History:  Procedure Laterality Date  . ABDOMINAL HYSTERECTOMY  1977  . ANTERIOR AND POSTERIOR REPAIR  2014  . APPENDECTOMY  2016   ruptured  . BILATERAL KNEE ARTHROSCOPY  2000s  . FOOT NEUROMA SURGERY Bilateral 08/1976  . HERNIA REPAIR     as a  child  . TONSILLECTOMY    . TUBAL LIGATION  1975    Family History  Problem Relation Age of Onset  . Breast cancer Maternal Aunt     Social:  reports that she quit smoking about 30 years ago. Her smoking use included cigarettes. She has a 15.00 pack-year smoking history. She has never used smokeless tobacco. No history on file for alcohol use and drug use.  Allergies: No Known Allergies  Medications: I have reviewed the patient's current medications.  No results found for this or any previous visit (from the past 48 hour(s)).  No results found.  ROS - all of the below systems have been reviewed with the patient and positives are indicated with bold text General: chills, fever or night sweats Eyes: blurry vision or double vision ENT: epistaxis or sore throat Allergy/Immunology: itchy/watery eyes or nasal congestion Hematologic/Lymphatic: bleeding problems, blood clots or swollen lymph nodes Endocrine: temperature intolerance or unexpected weight changes Breast: new or changing breast lumps or nipple discharge Resp: cough, shortness of breath, or wheezing CV: chest pain or dyspnea on exertion GI: as per HPI GU: dysuria, trouble voiding, or hematuria MSK: joint pain or joint stiffness Neuro: TIA or stroke symptoms Derm: pruritus and skin lesion changes Psych: anxiety and depression  PE Blood pressure 120/66, pulse 69, temperature 98 F (36.7 C), temperature source Oral,  resp. rate 15, weight 68.9 kg, SpO2 99 %. Constitutional: NAD; conversant; no deformities Eyes: Moist conjunctiva; no lid lag; anicteric; PERRL Neck: Trachea midline; no thyromegaly Lungs: Normal respiratory effort; no tactile fremitus CV: RRR; no palpable thrills; no pitting edema GI: Abd soft, nt, nd, bulge L groin; no palpable hepatosplenomegaly MSK: Normal gait; no clubbing/cyanosis Psychiatric: Appropriate affect; alert and oriented x3 Lymphatic: No palpable cervical or axillary lymphadenopathy  No  results found for this or any previous visit (from the past 48 hour(s)).  No results found.  Imaging: n/a  A/P: [name] is an 69 y.o. female with  Left inguinal hernia  To OR for robotic left inguinal hernia repair with mesh All questions asked and answered ERAS  Leighton Ruff. Redmond Pulling, MD, FACS General, Bariatric, & Minimally Invasive Surgery [facility], [place]

## 2020-08-01 NOTE — Anesthesia Procedure Notes (Signed)
Procedure Name: Intubation Date/Time: 08/01/2020 10:12 AM Performed by: Freddrick March, MD Pre-anesthesia Checklist: Patient identified, Emergency Drugs available, Suction available and Patient being monitored Patient Re-evaluated:Patient Re-evaluated prior to induction Oxygen Delivery Method: Circle system utilized Preoxygenation: Pre-oxygenation with 100% oxygen Induction Type: IV induction Ventilation: Mask ventilation without difficulty Laryngoscope Size: Glidescope and 3 Grade View: Grade I Tube type: Oral Tube size: 7.0 mm Number of attempts: 2 Airway Equipment and Method: Video-laryngoscopy and Stylet Placement Confirmation: ETT inserted through vocal cords under direct vision,  positive ETCO2 and breath sounds checked- equal and bilateral Secured at: 22 cm Tube secured with: Tape Dental Injury: Teeth and Oropharynx as per pre-operative assessment  Comments: MD DL x1 with MAC 3. ETT placed in esophagus. Glidescope 3 used, grade 1 view. Tube passed easily.

## 2020-08-01 NOTE — Anesthesia Preprocedure Evaluation (Addendum)
Anesthesia Evaluation  Patient identified by MRN, date of birth, ID band Patient awake    Reviewed: Allergy & Precautions, NPO status , Patient's Chart, lab work & pertinent test results  History of Anesthesia Complications (+) PONV  Airway Mallampati: II  TM Distance: >3 FB Neck ROM: Full  Mouth opening: Limited Mouth Opening  Dental no notable dental hx. (+) Teeth Intact, Dental Advisory Given   Pulmonary neg pulmonary ROS, former smoker,    Pulmonary exam normal breath sounds clear to auscultation       Cardiovascular negative cardio ROS Normal cardiovascular exam Rhythm:Regular Rate:Normal     Neuro/Psych negative neurological ROS  negative psych ROS   GI/Hepatic negative GI ROS, Neg liver ROS,   Endo/Other  Hypothyroidism   Renal/GU negative Renal ROS  negative genitourinary   Musculoskeletal  (+) Arthritis ,   Abdominal   Peds  Hematology negative hematology ROS (+)   Anesthesia Other Findings   Reproductive/Obstetrics                            Anesthesia Physical Anesthesia Plan  ASA: II  Anesthesia Plan: General   Post-op Pain Management:    Induction: Intravenous  PONV Risk Score and Plan: 4 or greater and Midazolam, Dexamethasone, Ondansetron and Scopolamine patch - Pre-op  Airway Management Planned: Oral ETT  Additional Equipment:   Intra-op Plan:   Post-operative Plan: Extubation in OR  Informed Consent: I have reviewed the patients History and Physical, chart, labs and discussed the procedure including the risks, benefits and alternatives for the proposed anesthesia with the patient or authorized representative who has indicated his/her understanding and acceptance.     Dental advisory given  Plan Discussed with: CRNA  Anesthesia Plan Comments:         Anesthesia Quick Evaluation

## 2020-08-01 NOTE — Anesthesia Postprocedure Evaluation (Signed)
Anesthesia Post Note  Patient: [name]  Procedure(s) Performed: ROBOTIC LEFT INGUINAL HERNIA REPAIR WITH MESH (N/A Abdomen)     Patient location during evaluation: PACU Anesthesia Type: General Level of consciousness: awake and alert Pain management: pain level controlled Vital Signs Assessment: post-procedure vital signs reviewed and stable Respiratory status: spontaneous breathing, nonlabored ventilation, respiratory function stable and patient connected to nasal cannula oxygen Cardiovascular status: blood pressure returned to baseline and stable Postop Assessment: no apparent nausea or vomiting Anesthetic complications: no   No complications documented.  Last Vitals:  Vitals:   08/01/20 1300 08/01/20 1313  BP: (!) 105/51 (!) 108/59  Pulse: 67   Resp: 12   Temp: 36.4 C 36.4 C  SpO2: 96%     Last Pain:  Vitals:   08/01/20 1300  TempSrc:   PainSc: 0-No pain                 Randal Goens L Hulbert Branscome

## 2020-08-05 ENCOUNTER — Encounter (HOSPITAL_COMMUNITY): Payer: Self-pay | Admitting: General Surgery

## 2020-08-06 DIAGNOSIS — E7800 Pure hypercholesterolemia, unspecified: Secondary | ICD-10-CM | POA: Diagnosis not present

## 2020-08-06 DIAGNOSIS — E039 Hypothyroidism, unspecified: Secondary | ICD-10-CM | POA: Diagnosis not present

## 2020-09-04 DIAGNOSIS — M5136 Other intervertebral disc degeneration, lumbar region: Secondary | ICD-10-CM | POA: Diagnosis not present

## 2020-09-15 DIAGNOSIS — M7062 Trochanteric bursitis, left hip: Secondary | ICD-10-CM | POA: Diagnosis not present

## 2020-10-06 DIAGNOSIS — R69 Illness, unspecified: Secondary | ICD-10-CM | POA: Diagnosis not present

## 2020-10-27 DIAGNOSIS — J309 Allergic rhinitis, unspecified: Secondary | ICD-10-CM | POA: Diagnosis not present

## 2020-10-27 DIAGNOSIS — K582 Mixed irritable bowel syndrome: Secondary | ICD-10-CM | POA: Diagnosis not present

## 2020-10-27 DIAGNOSIS — M7062 Trochanteric bursitis, left hip: Secondary | ICD-10-CM | POA: Diagnosis not present

## 2020-10-27 DIAGNOSIS — E039 Hypothyroidism, unspecified: Secondary | ICD-10-CM | POA: Diagnosis not present

## 2020-10-27 DIAGNOSIS — Z0000 Encounter for general adult medical examination without abnormal findings: Secondary | ICD-10-CM | POA: Diagnosis not present

## 2020-10-27 DIAGNOSIS — E7800 Pure hypercholesterolemia, unspecified: Secondary | ICD-10-CM | POA: Diagnosis not present

## 2020-11-04 DIAGNOSIS — M1612 Unilateral primary osteoarthritis, left hip: Secondary | ICD-10-CM | POA: Diagnosis not present

## 2020-11-04 DIAGNOSIS — R030 Elevated blood-pressure reading, without diagnosis of hypertension: Secondary | ICD-10-CM | POA: Diagnosis not present

## 2020-11-04 DIAGNOSIS — M7062 Trochanteric bursitis, left hip: Secondary | ICD-10-CM | POA: Diagnosis not present

## 2020-11-04 DIAGNOSIS — M25552 Pain in left hip: Secondary | ICD-10-CM | POA: Diagnosis not present

## 2020-11-04 DIAGNOSIS — M5416 Radiculopathy, lumbar region: Secondary | ICD-10-CM | POA: Diagnosis not present

## 2020-11-07 DIAGNOSIS — M1612 Unilateral primary osteoarthritis, left hip: Secondary | ICD-10-CM | POA: Diagnosis not present

## 2020-11-10 DIAGNOSIS — M1612 Unilateral primary osteoarthritis, left hip: Secondary | ICD-10-CM | POA: Diagnosis not present

## 2020-11-17 NOTE — Patient Instructions (Addendum)
DUE TO COVID-19 ONLY ONE VISITOR IS ALLOWED TO COME WITH YOU AND STAY IN THE WAITING ROOM ONLY DURING PRE OP AND PROCEDURE DAY OF SURGERY. THE 1 VISITOR  MAY VISIT WITH YOU AFTER SURGERY IN YOUR PRIVATE ROOM DURING VISITING HOURS ONLY!  YOU NEED TO HAVE A COVID 19 TEST ON: 11/24/20 @ 2:00 PM , THIS TEST MUST BE DONE BEFORE SURGERY,  COVID TESTING SITE [place] [id number], IT IS ON THE RIGHT GOING OUT [address] APPROXIMATELY  2 MINUTES PAST [employer] ON THE RIGHT. ONCE YOUR COVID TEST IS COMPLETED,  PLEASE BEGIN THE QUARANTINE INSTRUCTIONS AS OUTLINED IN YOUR HANDOUT.                [name]    Your procedure is scheduled on: 11/25/20   Report to [facility] Main  Entrance   Report to admitting at: 9:00 AM     Call this number if you have problems the morning of surgery [phone]   NO SOLID FOOD AFTER MIDNIGHT THE NIGHT PRIOR TO SURGERY. NOTHING BY MOUTH EXCEPT CLEAR LIQUIDS UNTIL: 8:30 AM . PLEASE FINISH ENSURE DRINK PER SURGEON ORDER  WHICH NEEDS TO BE COMPLETED AT: 8:30 AM .  CLEAR LIQUID DIET   Foods Allowed                                                                     Foods Excluded  Coffee and tea, regular and decaf                             liquids that you cannot  Plain Jell-O any favor except red or purple                                           see through such as: Fruit ices (not with fruit pulp)                                     milk, soups, orange juice  Iced Popsicles                                    All solid food Carbonated beverages, regular and diet                                    Cranberry, grape and apple juices Sports drinks like Gatorade Lightly seasoned clear broth or consume(fat free) Sugar, honey syrup  Sample Menu Breakfast                                Lunch  Supper Cranberry juice                    Beef broth                             Chicken broth Jell-O                                     Grape juice                           Apple juice Coffee or tea                        Jell-O                                      Popsicle                                                Coffee or tea                        Coffee or tea  _____________________________________________________________________  BRUSH YOUR TEETH MORNING OF SURGERY AND RINSE YOUR MOUTH OUT, NO CHEWING GUM CANDY OR MINTS.    Take these medicines the morning of surgery with A SIP OF WATER: Allegra and use Flonase as needed.                               You may not have any metal on your body including hair pins and              piercings  Do not wear jewelry, make-up, lotions, powders or perfumes, deodorant             Do not wear nail polish on your fingernails.  Do not shave  48 hours prior to surgery.            Do not bring valuables to the hospital. [place].  Contacts, dentures or bridgework may not be worn into surgery.  Leave suitcase in the car. After surgery it may be brought to your room.     Patients discharged the day of surgery will not be allowed to drive home. IF YOU ARE HAVING SURGERY AND GOING HOME THE SAME DAY, YOU MUST HAVE AN ADULT TO DRIVE YOU HOME AND BE WITH YOU FOR 24 HOURS. YOU MAY GO HOME BY TAXI OR UBER OR ORTHERWISE, BUT AN ADULT MUST ACCOMPANY YOU HOME AND STAY WITH YOU FOR 24 HOURS.  Name and phone number of your driver:  Special Instructions: N/A              Please read over the following fact sheets you were given: _____________________________________________________________________         [facility] - Preparing for Surgery Before surgery, you can play an important role.  Because skin is not sterile, your skin  needs to be as free of germs as possible.  You can reduce the number of germs on your skin by washing with CHG (chlorahexidine gluconate) soap before surgery.   CHG is an antiseptic cleaner which kills germs and bonds with the skin to continue killing germs even after washing. Please DO NOT use if you have an allergy to CHG or antibacterial soaps.  If your skin becomes reddened/irritated stop using the CHG and inform your nurse when you arrive at Short Stay. Do not shave (including legs and underarms) for at least 48 hours prior to the first CHG shower.  You may shave your face/neck. Please follow these instructions carefully:  1.  Shower with CHG Soap the night before surgery and the  morning of Surgery.  2.  If you choose to wash your hair, wash your hair first as usual with your  normal  shampoo.  3.  After you shampoo, rinse your hair and body thoroughly to remove the  shampoo.                           4.  Use CHG as you would any other liquid soap.  You can apply chg directly  to the skin and wash                       Gently with a scrungie or clean washcloth.  5.  Apply the CHG Soap to your body ONLY FROM THE NECK DOWN.   Do not use on face/ open                           Wound or open sores. Avoid contact with eyes, ears mouth and genitals (private parts).                       Wash face,  Genitals (private parts) with your normal soap.             6.  Wash thoroughly, paying special attention to the area where your surgery  will be performed.  7.  Thoroughly rinse your body with warm water from the neck down.  8.  DO NOT shower/wash with your normal soap after using and rinsing off  the CHG Soap.                9.  Pat yourself dry with a clean towel.            10.  Wear clean pajamas.            11.  Place clean sheets on your bed the night of your first shower and do not  sleep with pets. Day of Surgery : Do not apply any lotions/deodorants the morning of surgery.  Please wear clean clothes to the hospital/surgery center.  FAILURE TO FOLLOW THESE INSTRUCTIONS MAY RESULT IN THE CANCELLATION OF YOUR SURGERY PATIENT  SIGNATURE_________________________________  NURSE SIGNATURE__________________________________  ________________________________________________________________________   [name]  An incentive spirometer is a tool that can help keep your lungs clear and active. This tool measures how well you are filling your lungs with each breath. Taking long deep breaths may help reverse or decrease the chance of developing breathing (pulmonary) problems (especially infection) following:  A long period of time when you are unable to move or be active. BEFORE THE PROCEDURE   If the spirometer includes an indicator to  show your best effort, your nurse or respiratory therapist will set it to a desired goal.  If possible, sit up straight or lean slightly forward. Try not to slouch.  Hold the incentive spirometer in an upright position. INSTRUCTIONS FOR USE  1. Sit on the edge of your bed if possible, or sit up as far as you can in bed or on a chair. 2. Hold the incentive spirometer in an upright position. 3. Breathe out normally. 4. Place the mouthpiece in your mouth and seal your lips tightly around it. 5. Breathe in slowly and as deeply as possible, raising the piston or the ball toward the top of the column. 6. Hold your breath for 3-5 seconds or for as long as possible. Allow the piston or ball to fall to the bottom of the column. 7. Remove the mouthpiece from your mouth and breathe out normally. 8. Rest for a few seconds and repeat Steps 1 through 7 at least 10 times every 1-2 hours when you are awake. Take your time and take a few normal breaths between deep breaths. 9. The spirometer may include an indicator to show your best effort. Use the indicator as a goal to work toward during each repetition. 10. After each set of 10 deep breaths, practice coughing to be sure your lungs are clear. If you have an incision (the cut made at the time of surgery), support your incision when coughing  by placing a pillow or rolled up towels firmly against it. Once you are able to get out of bed, walk around indoors and cough well. You may stop using the incentive spirometer when instructed by your caregiver.  RISKS AND COMPLICATIONS  Take your time so you do not get dizzy or light-headed.  If you are in pain, you may need to take or ask for pain medication before doing incentive spirometry. It is harder to take a deep breath if you are having pain. AFTER USE  Rest and breathe slowly and easily.  It can be helpful to keep track of a log of your progress. Your caregiver can provide you with a simple table to help with this. If you are using the spirometer at home, follow these instructions: [place] IF:   You are having difficultly using the spirometer.  You have trouble using the spirometer as often as instructed.  Your pain medication is not giving enough relief while using the spirometer.  You develop fever of 100.5 F (38.1 C) or higher. SEEK IMMEDIATE MEDICAL CARE IF:   You cough up bloody sputum that had not been present before.  You develop fever of 102 F (38.9 C) or greater.  You develop worsening pain at or near the incision site. MAKE SURE YOU:   Understand these instructions.  Will watch your condition.  Will get help right away if you are not doing well or get worse. Document Released: 03/28/2007 Document Revised: 02/07/2012 Document Reviewed: 05/29/2007 [facility] Patient Information 2014 [place], [place].   ________________________________________________________________________

## 2020-11-18 ENCOUNTER — Other Ambulatory Visit: Payer: Self-pay

## 2020-11-18 ENCOUNTER — Encounter (HOSPITAL_COMMUNITY): Payer: Self-pay

## 2020-11-18 ENCOUNTER — Encounter (HOSPITAL_COMMUNITY)
Admission: RE | Admit: 2020-11-18 | Discharge: 2020-11-18 | Disposition: A | Discharge status: Home / Self Care | Payer: Medicare HMO | Source: Ambulatory Visit | Attending: Orthopedic Surgery | Admitting: Orthopedic Surgery

## 2020-11-18 DIAGNOSIS — Z01812 Encounter for preprocedural laboratory examination: Secondary | ICD-10-CM | POA: Insufficient documentation

## 2020-11-18 LAB — SURGICAL PCR SCREEN
MRSA, PCR: NEGATIVE
Staphylococcus aureus: NEGATIVE

## 2020-11-18 LAB — CBC
HCT: 40.3 % (ref 36.0–46.0)
Hemoglobin: 13 g/dL (ref 12.0–15.0)
MCH: 30.8 pg (ref 26.0–34.0)
MCHC: 32.3 g/dL (ref 30.0–36.0)
MCV: 95.5 fL (ref 80.0–100.0)
Platelets: 370 10*3/uL (ref 150–400)
RBC: 4.22 MIL/uL (ref 3.87–5.11)
RDW: 11.7 % (ref 11.5–15.5)
WBC: 8.5 10*3/uL (ref 4.0–10.5)
nRBC: 0 % (ref 0.0–0.2)

## 2020-11-18 NOTE — Progress Notes (Signed)
COVID Vaccine Completed: Yes Date COVID Vaccine completed: 10/06/20 COVID vaccine manufacturer:  Moderna     PCP - Dr. Shirline Frees  Cardiologist -   Chest x-ray -  EKG -  Stress Test -  ECHO -  Cardiac Cath -  Pacemaker/ICD device last checked:  Sleep Study -  CPAP -   Fasting Blood Sugar -  Checks Blood Sugar _____ times a day  Blood Thinner Instructions: Aspirin Instructions: Last Dose:  Anesthesia review:   Patient denies shortness of breath, fever, cough and chest pain at PAT appointment   Patient verbalized understanding of instructions that were given to them at the PAT appointment. Patient was also instructed that they will need to review over the PAT instructions again at home before surgery.

## 2020-11-24 ENCOUNTER — Other Ambulatory Visit (HOSPITAL_COMMUNITY)
Admission: RE | Admit: 2020-11-24 | Discharge: 2020-11-24 | Disposition: A | Discharge status: Home / Self Care | Payer: Medicare HMO | Source: Ambulatory Visit | Attending: Orthopedic Surgery | Admitting: Orthopedic Surgery

## 2020-11-24 ENCOUNTER — Encounter (HOSPITAL_COMMUNITY): Payer: Self-pay | Admitting: Orthopedic Surgery

## 2020-11-24 DIAGNOSIS — Z20822 Contact with and (suspected) exposure to covid-19: Secondary | ICD-10-CM | POA: Diagnosis not present

## 2020-11-24 DIAGNOSIS — Z01812 Encounter for preprocedural laboratory examination: Secondary | ICD-10-CM | POA: Insufficient documentation

## 2020-11-24 NOTE — H&P (Signed)
TOTAL HIP ADMISSION H&P  Patient is admitted for left total hip arthroplasty, anterior approach.  Subjective:  Chief Complaint:   Left hip OA / pain  HPI: [name], 69 y.o. female, has a history of pain and functional disability in the left hip(s) due to arthritis and patient has failed non-surgical conservative treatments for greater than 12 weeks to include NSAID's and/or analgesics and activity modification.  Onset of symptoms was gradual starting 1 year ago with gradually worsening course since that time.The patient noted no past surgery on the left hip(s).  Patient currently rates pain in the left hip at 10 out of 10 with activity. Patient has night pain, worsening of pain with activity and weight bearing, pain that interfers with activities of daily living and pain with passive range of motion. Patient has evidence of periarticular osteophytes and joint space narrowing by imaging studies. This condition presents safety issues increasing the risk of falls.  There is no current active infection.  Risks, benefits and expectations were discussed with the patient.  Risks including but not limited to the risk of anesthesia, blood clots, nerve damage, blood vessel damage, failure of the prosthesis, infection and up to and including death.  Patient understand the risks, benefits and expectations and wishes to proceed with surgery.   D/C Plans:       Home (obs)  Post-op Meds:       No Rx given   Tranexamic Acid:      To be given - IV   Decadron:      Is to be given  FYI:     ASA  Norco  Will need Zofran Rx for home  DME:   Pt already has equipment  PT:   [facility]  Pharmacy: [facility] - [address]    Past Medical History:  Diagnosis Date  . Anginal pain ([place]) 2016   Due to Job stress  . Arthritis    Back, hips, knees, hands  . Cancer ([place]) 1977   cervical  . Complication of anesthesia   . Hypothyroidism   . PONV (postoperative nausea and vomiting)     Past Surgical History:   Procedure Laterality Date  . ABDOMINAL HYSTERECTOMY  1977  . ANTERIOR AND POSTERIOR REPAIR  2014  . APPENDECTOMY  2016   ruptured  . BILATERAL KNEE ARTHROSCOPY  2000s  . FOOT NEUROMA SURGERY Bilateral 08/1976  . HERNIA REPAIR     as a child  . TONSILLECTOMY    . TUBAL LIGATION  1975  . XI ROBOTIC ASSISTED INGUINAL HERNIA REPAIR WITH MESH N/A 08/01/2020   Procedure: ROBOTIC LEFT INGUINAL HERNIA REPAIR WITH MESH;  Surgeon: Greer Pickerel, MD;  Location: [facility];  Service: General;  Laterality: N/A;    No current facility-administered medications for this encounter.   Current Outpatient Medications  Medication Sig Dispense Refill Last Dose  . fexofenadine (ALLEGRA) 180 MG tablet Take 180 mg by mouth daily as needed for allergies.     . fluticasone (FLONASE) 50 MCG/ACT nasal spray Place 1 spray into both nostrils daily as needed for allergies or rhinitis.     . Misc Natural Products (OSTEO BI-FLEX ADV TRIPLE ST) TABS Take 2 tablets by mouth daily.     . Multiple Vitamin (MULTIVITAMIN WITH MINERALS) TABS tablet Take 1 tablet by mouth daily.     Marland Kitchen OVER THE COUNTER MEDICATION Take 2 tablets by mouth daily. Circulation and Vein Support     . SYNTHROID 88 MCG tablet Take  88 mcg by mouth daily before breakfast.     . Turmeric 400 MG CAPS Take 800 mg by mouth daily.     . traMADol (ULTRAM) 50 MG tablet Take 1 tablet (50 mg total) by mouth every 6 (six) hours as needed for severe pain. (Patient not taking: No sig reported) 12 tablet 0 Not Taking at Unknown time   No Known Allergies   Social History   Tobacco Use  . Smoking status: Former Smoker    Packs/day: 1.00    Years: 15.00    Pack years: 15.00    Types: Cigarettes    Quit date: 1991    Years since quitting: 31.0  . Smokeless tobacco: Never Used  Substance Use Topics  . Alcohol use: Not Currently    Comment: ocasional    Family History  Problem Relation Age of Onset  . Breast cancer Maternal Aunt      Review of Systems   Constitutional: Negative.   HENT: Negative.   Eyes: Negative.   Respiratory: Negative.   Cardiovascular: Negative.   Gastrointestinal: Negative.   Genitourinary: Negative.   Musculoskeletal: Positive for joint pain.  Skin: Negative.   Neurological: Negative.   Endo/Heme/Allergies: Negative.   Psychiatric/Behavioral: Negative.      Objective:  Physical Exam Constitutional:      Appearance: She is well-developed.  HENT:     Head: Normocephalic.  Eyes:     Pupils: Pupils are equal, round, and reactive to light.  Neck:     Thyroid: No thyromegaly.     Vascular: No JVD.     Trachea: No tracheal deviation.  Cardiovascular:     Rate and Rhythm: Normal rate and regular rhythm.     Pulses: Intact distal pulses.  Pulmonary:     Effort: Pulmonary effort is normal. No respiratory distress.     Breath sounds: Normal breath sounds. No wheezing.  Abdominal:     Palpations: Abdomen is soft.     Tenderness: There is no abdominal tenderness. There is no guarding.  Musculoskeletal:     Cervical back: Neck supple.     Left hip: Tenderness and bony tenderness present. Decreased range of motion. Decreased strength.  Lymphadenopathy:     Cervical: No cervical adenopathy.  Skin:    General: Skin is warm and dry.  Neurological:     Mental Status: She is alert and oriented to person, place, and time.  Psychiatric:        Mood and Affect: Mood and affect normal.       Labs:  Estimated body mass index is 23.53 kg/m as calculated from the following:   Height as of 11/18/20: 5' 6.5" (1.689 m).   Weight as of 11/18/20: 67.1 kg.   Imaging Review Plain radiographs demonstrate severe degenerative joint disease of the left hip(s). The bone quality appears to be good for age and reported activity level.      Assessment/Plan:  End stage arthritis, left hip(s)  The patient history, physical examination, clinical judgement of the provider and imaging studies are consistent with end  stage degenerative joint disease of the left hip(s) and total hip arthroplasty is deemed medically necessary. The treatment options including medical management, injection therapy, arthroscopy and arthroplasty were discussed at length. The risks and benefits of total hip arthroplasty were presented and reviewed. The risks due to aseptic loosening, infection, stiffness, dislocation/subluxation,  thromboembolic complications and other imponderables were discussed.  The patient acknowledged the explanation, agreed to proceed with the plan  and consent was signed. Patient is being admitted for treatment for surgery, pain control, PT, OT, prophylactic antibiotics, VTE prophylaxis, progressive ambulation and ADL's and discharge planning.The patient is planning to be discharged home.

## 2020-11-25 ENCOUNTER — Encounter (HOSPITAL_COMMUNITY)
Admission: RE | Disposition: A | Discharge status: Home / Self Care | Payer: Self-pay | Source: Home / Self Care | Attending: Orthopedic Surgery

## 2020-11-25 ENCOUNTER — Encounter (HOSPITAL_COMMUNITY): Payer: Self-pay | Admitting: Orthopedic Surgery

## 2020-11-25 ENCOUNTER — Ambulatory Visit (HOSPITAL_COMMUNITY): Payer: Medicare HMO | Admitting: Anesthesiology

## 2020-11-25 ENCOUNTER — Ambulatory Visit (HOSPITAL_COMMUNITY): Payer: Medicare HMO

## 2020-11-25 ENCOUNTER — Observation Stay (HOSPITAL_COMMUNITY)
Admission: RE | Admit: 2020-11-25 | Discharge: 2020-11-26 | Disposition: A | Discharge status: Home / Self Care | Payer: Medicare HMO | Attending: Orthopedic Surgery | Admitting: Orthopedic Surgery

## 2020-11-25 ENCOUNTER — Other Ambulatory Visit: Payer: Self-pay

## 2020-11-25 ENCOUNTER — Observation Stay (HOSPITAL_COMMUNITY): Payer: Medicare HMO

## 2020-11-25 DIAGNOSIS — Z96642 Presence of left artificial hip joint: Secondary | ICD-10-CM | POA: Diagnosis not present

## 2020-11-25 DIAGNOSIS — Z87891 Personal history of nicotine dependence: Secondary | ICD-10-CM | POA: Diagnosis not present

## 2020-11-25 DIAGNOSIS — M25552 Pain in left hip: Secondary | ICD-10-CM | POA: Diagnosis present

## 2020-11-25 DIAGNOSIS — Z96649 Presence of unspecified artificial hip joint: Secondary | ICD-10-CM

## 2020-11-25 DIAGNOSIS — E039 Hypothyroidism, unspecified: Secondary | ICD-10-CM | POA: Insufficient documentation

## 2020-11-25 DIAGNOSIS — Z471 Aftercare following joint replacement surgery: Secondary | ICD-10-CM | POA: Diagnosis not present

## 2020-11-25 DIAGNOSIS — M1612 Unilateral primary osteoarthritis, left hip: Principal | ICD-10-CM | POA: Insufficient documentation

## 2020-11-25 DIAGNOSIS — Z419 Encounter for procedure for purposes other than remedying health state, unspecified: Secondary | ICD-10-CM

## 2020-11-25 HISTORY — PX: TOTAL HIP ARTHROPLASTY: SHX124

## 2020-11-25 LAB — SARS CORONAVIRUS 2 (TAT 6-24 HRS): SARS Coronavirus 2: NEGATIVE

## 2020-11-25 LAB — ABO/RH: ABO/RH(D): O NEG

## 2020-11-25 LAB — TYPE AND SCREEN
ABO/RH(D): O NEG
Antibody Screen: NEGATIVE

## 2020-11-25 SURGERY — ARTHROPLASTY, HIP, TOTAL, ANTERIOR APPROACH
Anesthesia: Spinal | Site: Hip | Laterality: Left

## 2020-11-25 MED ORDER — METHOCARBAMOL 500 MG PO TABS
500.0000 mg | ORAL_TABLET | Freq: Four times a day (QID) | ORAL | Status: DC | PRN
  Administered 2020-11-25: 500 mg via ORAL
  Filled 2020-11-25 (×2): qty 1

## 2020-11-25 MED ORDER — ONDANSETRON HCL 4 MG/2ML IJ SOLN
INTRAMUSCULAR | Status: DC | PRN
  Administered 2020-11-25: 4 mg via INTRAVENOUS

## 2020-11-25 MED ORDER — DIPHENHYDRAMINE HCL 12.5 MG/5ML PO ELIX: 12.5000 mg | ORAL_SOLUTION | ORAL | Status: DC | PRN

## 2020-11-25 MED ORDER — ORAL CARE MOUTH RINSE: 15.0000 mL | Freq: Once | OROMUCOSAL | Status: AC

## 2020-11-25 MED ORDER — LEVOTHYROXINE SODIUM 88 MCG PO TABS
88.0000 ug | ORAL_TABLET | Freq: Every day | ORAL | Status: DC
  Administered 2020-11-26: 88 ug via ORAL
  Filled 2020-11-25: qty 1

## 2020-11-25 MED ORDER — MENTHOL 3 MG MT LOZG: 1.0000 | LOZENGE | OROMUCOSAL | Status: DC | PRN

## 2020-11-25 MED ORDER — ACETAMINOPHEN 500 MG PO TABS
ORAL_TABLET | ORAL | Status: AC
  Filled 2020-11-25: qty 2

## 2020-11-25 MED ORDER — METOCLOPRAMIDE HCL 5 MG PO TABS: 5.0000 mg | ORAL_TABLET | Freq: Three times a day (TID) | ORAL | Status: DC | PRN

## 2020-11-25 MED ORDER — CEFAZOLIN SODIUM-DEXTROSE 2-4 GM/100ML-% IV SOLN
2.0000 g | INTRAVENOUS | Status: AC
  Administered 2020-11-25: 2 g via INTRAVENOUS
  Filled 2020-11-25: qty 100

## 2020-11-25 MED ORDER — OXYCODONE HCL 5 MG PO TABS: 5.0000 mg | ORAL_TABLET | Freq: Once | ORAL | Status: DC | PRN

## 2020-11-25 MED ORDER — STERILE WATER FOR IRRIGATION IR SOLN
Status: DC | PRN
  Administered 2020-11-25: 2000 mL

## 2020-11-25 MED ORDER — 0.9 % SODIUM CHLORIDE (POUR BTL) OPTIME
TOPICAL | Status: DC | PRN
  Administered 2020-11-25: 1000 mL

## 2020-11-25 MED ORDER — HYDROCODONE-ACETAMINOPHEN 7.5-325 MG PO TABS: 1.0000 | ORAL_TABLET | ORAL | Status: DC | PRN

## 2020-11-25 MED ORDER — MIDAZOLAM HCL 5 MG/5ML IJ SOLN
INTRAMUSCULAR | Status: DC | PRN
  Administered 2020-11-25: 2 mg via INTRAVENOUS

## 2020-11-25 MED ORDER — TRANEXAMIC ACID-NACL 1000-0.7 MG/100ML-% IV SOLN
1000.0000 mg | INTRAVENOUS | Status: AC
  Administered 2020-11-25: 1000 mg via INTRAVENOUS
  Filled 2020-11-25: qty 100

## 2020-11-25 MED ORDER — ALUM & MAG HYDROXIDE-SIMETH 200-200-20 MG/5ML PO SUSP: 15.0000 mL | ORAL | Status: DC | PRN

## 2020-11-25 MED ORDER — MIDAZOLAM HCL 2 MG/2ML IJ SOLN
INTRAMUSCULAR | Status: AC
  Filled 2020-11-25: qty 2

## 2020-11-25 MED ORDER — HYDROMORPHONE HCL 1 MG/ML IJ SOLN
INTRAMUSCULAR | Status: AC
  Administered 2020-11-25: 0.25 mg via INTRAVENOUS
  Filled 2020-11-25: qty 1

## 2020-11-25 MED ORDER — PROMETHAZINE HCL 25 MG/ML IJ SOLN
6.2500 mg | INTRAMUSCULAR | Status: DC | PRN
Start: 2020-11-25 — End: 2020-11-25

## 2020-11-25 MED ORDER — SCOPOLAMINE 1 MG/3DAYS TD PT72
MEDICATED_PATCH | TRANSDERMAL | Status: AC
  Filled 2020-11-25: qty 1

## 2020-11-25 MED ORDER — PROPOFOL 10 MG/ML IV BOLUS
INTRAVENOUS | Status: AC
  Filled 2020-11-25: qty 20

## 2020-11-25 MED ORDER — METHOCARBAMOL 500 MG IVPB - SIMPLE MED
INTRAVENOUS | Status: AC
  Administered 2020-11-25: 500 mg
  Filled 2020-11-25: qty 50

## 2020-11-25 MED ORDER — METOCLOPRAMIDE HCL 5 MG/ML IJ SOLN: 5.0000 mg | Freq: Three times a day (TID) | INTRAMUSCULAR | Status: DC | PRN

## 2020-11-25 MED ORDER — MAGNESIUM CITRATE PO SOLN: 1.0000 | Freq: Once | ORAL | Status: DC | PRN

## 2020-11-25 MED ORDER — PROPOFOL 10 MG/ML IV BOLUS
INTRAVENOUS | Status: DC | PRN
  Administered 2020-11-25: 20 mg via INTRAVENOUS

## 2020-11-25 MED ORDER — PHENOL 1.4 % MT LIQD: 1.0000 | OROMUCOSAL | Status: DC | PRN

## 2020-11-25 MED ORDER — ONDANSETRON HCL 4 MG PO TABS: 4.0000 mg | ORAL_TABLET | Freq: Four times a day (QID) | ORAL | Status: DC | PRN

## 2020-11-25 MED ORDER — DEXAMETHASONE SODIUM PHOSPHATE 10 MG/ML IJ SOLN
10.0000 mg | Freq: Once | INTRAMUSCULAR | Status: AC
  Administered 2020-11-25: 8 mg via INTRAVENOUS

## 2020-11-25 MED ORDER — POLYETHYLENE GLYCOL 3350 17 G PO PACK
17.0000 g | PACK | Freq: Two times a day (BID) | ORAL | Status: DC
  Administered 2020-11-25 – 2020-11-26 (×2): 17 g via ORAL
  Filled 2020-11-25 (×2): qty 1

## 2020-11-25 MED ORDER — FERROUS SULFATE 325 (65 FE) MG PO TABS
325.0000 mg | ORAL_TABLET | Freq: Three times a day (TID) | ORAL | Status: DC
  Administered 2020-11-25 – 2020-11-26 (×3): 325 mg via ORAL
  Filled 2020-11-25 (×3): qty 1

## 2020-11-25 MED ORDER — LACTATED RINGERS IV SOLN: INTRAVENOUS | Status: DC

## 2020-11-25 MED ORDER — DEXAMETHASONE SODIUM PHOSPHATE 10 MG/ML IJ SOLN
INTRAMUSCULAR | Status: AC
  Filled 2020-11-25: qty 1

## 2020-11-25 MED ORDER — FENTANYL CITRATE (PF) 100 MCG/2ML IJ SOLN
INTRAMUSCULAR | Status: AC
  Filled 2020-11-25: qty 2

## 2020-11-25 MED ORDER — BISACODYL 10 MG RE SUPP: 10.0000 mg | Freq: Every day | RECTAL | Status: DC | PRN

## 2020-11-25 MED ORDER — ACETAMINOPHEN 500 MG PO TABS: 1000.0000 mg | ORAL_TABLET | Freq: Once | ORAL | Status: DC

## 2020-11-25 MED ORDER — PROPOFOL 500 MG/50ML IV EMUL
INTRAVENOUS | Status: DC | PRN
  Administered 2020-11-25: 100 ug/kg/min via INTRAVENOUS

## 2020-11-25 MED ORDER — PHENYLEPHRINE 40 MCG/ML (10ML) SYRINGE FOR IV PUSH (FOR BLOOD PRESSURE SUPPORT)
PREFILLED_SYRINGE | INTRAVENOUS | Status: AC
  Filled 2020-11-25: qty 10

## 2020-11-25 MED ORDER — ACETAMINOPHEN 325 MG PO TABS: 325.0000 mg | ORAL_TABLET | Freq: Four times a day (QID) | ORAL | Status: DC | PRN

## 2020-11-25 MED ORDER — MORPHINE SULFATE (PF) 2 MG/ML IV SOLN: 0.5000 mg | INTRAVENOUS | Status: DC | PRN

## 2020-11-25 MED ORDER — CEFAZOLIN SODIUM-DEXTROSE 2-4 GM/100ML-% IV SOLN
2.0000 g | Freq: Four times a day (QID) | INTRAVENOUS | Status: AC
  Administered 2020-11-25 (×2): 2 g via INTRAVENOUS
  Filled 2020-11-25 (×2): qty 100

## 2020-11-25 MED ORDER — TRANEXAMIC ACID-NACL 1000-0.7 MG/100ML-% IV SOLN
1000.0000 mg | Freq: Once | INTRAVENOUS | Status: AC
  Administered 2020-11-25: 1000 mg via INTRAVENOUS
  Filled 2020-11-25: qty 100

## 2020-11-25 MED ORDER — SODIUM CHLORIDE 0.9 % IV SOLN: INTRAVENOUS | Status: DC

## 2020-11-25 MED ORDER — ASPIRIN 81 MG PO CHEW
81.0000 mg | CHEWABLE_TABLET | Freq: Two times a day (BID) | ORAL | Status: DC
  Administered 2020-11-25 – 2020-11-26 (×2): 81 mg via ORAL
  Filled 2020-11-25 (×2): qty 1

## 2020-11-25 MED ORDER — ONDANSETRON HCL 4 MG/2ML IJ SOLN: 4.0000 mg | Freq: Four times a day (QID) | INTRAMUSCULAR | Status: DC | PRN

## 2020-11-25 MED ORDER — PHENYLEPHRINE HCL (PRESSORS) 10 MG/ML IV SOLN
INTRAVENOUS | Status: AC
  Filled 2020-11-25: qty 1

## 2020-11-25 MED ORDER — CHLORHEXIDINE GLUCONATE 0.12 % MT SOLN
15.0000 mL | Freq: Once | OROMUCOSAL | Status: AC
  Administered 2020-11-25: 15 mL via OROMUCOSAL

## 2020-11-25 MED ORDER — MIDAZOLAM HCL 2 MG/2ML IJ SOLN: 0.5000 mg | Freq: Once | INTRAMUSCULAR | Status: DC | PRN

## 2020-11-25 MED ORDER — GLYCOPYRROLATE 0.2 MG/ML IJ SOLN
INTRAMUSCULAR | Status: DC | PRN
  Administered 2020-11-25: .1 mg via INTRAVENOUS

## 2020-11-25 MED ORDER — DOCUSATE SODIUM 100 MG PO CAPS
100.0000 mg | ORAL_CAPSULE | Freq: Two times a day (BID) | ORAL | Status: DC
  Administered 2020-11-25 – 2020-11-26 (×2): 100 mg via ORAL
  Filled 2020-11-25 (×2): qty 1

## 2020-11-25 MED ORDER — OXYCODONE HCL 5 MG/5ML PO SOLN
5.0000 mg | Freq: Once | ORAL | Status: DC | PRN
Start: 2020-11-25 — End: 2020-11-25

## 2020-11-25 MED ORDER — FLUTICASONE PROPIONATE 50 MCG/ACT NA SUSP
1.0000 | Freq: Every day | NASAL | Status: DC | PRN
  Filled 2020-11-25: qty 16

## 2020-11-25 MED ORDER — DEXAMETHASONE SODIUM PHOSPHATE 10 MG/ML IJ SOLN
10.0000 mg | Freq: Once | INTRAMUSCULAR | Status: AC
  Administered 2020-11-26: 10 mg via INTRAVENOUS
  Filled 2020-11-25: qty 1

## 2020-11-25 MED ORDER — PROPOFOL 1000 MG/100ML IV EMUL
INTRAVENOUS | Status: AC
  Filled 2020-11-25: qty 100

## 2020-11-25 MED ORDER — LORATADINE 10 MG PO TABS
10.0000 mg | ORAL_TABLET | Freq: Every day | ORAL | Status: DC
  Administered 2020-11-26: 10 mg via ORAL
  Filled 2020-11-25: qty 1

## 2020-11-25 MED ORDER — METHOCARBAMOL 1000 MG/10ML IJ SOLN
500.0000 mg | Freq: Four times a day (QID) | INTRAVENOUS | Status: DC | PRN
  Filled 2020-11-25: qty 5

## 2020-11-25 MED ORDER — HYDROMORPHONE HCL 1 MG/ML IJ SOLN
0.2500 mg | INTRAMUSCULAR | Status: DC | PRN
Start: 2020-11-25 — End: 2020-11-25
  Administered 2020-11-25: 0.25 mg via INTRAVENOUS

## 2020-11-25 MED ORDER — MEPERIDINE HCL 50 MG/ML IJ SOLN: 6.2500 mg | INTRAMUSCULAR | Status: DC | PRN

## 2020-11-25 MED ORDER — ONDANSETRON HCL 4 MG/2ML IJ SOLN
INTRAMUSCULAR | Status: AC
  Filled 2020-11-25: qty 2

## 2020-11-25 MED ORDER — HYDROCODONE-ACETAMINOPHEN 5-325 MG PO TABS
1.0000 | ORAL_TABLET | ORAL | Status: DC | PRN
  Administered 2020-11-25: 1 via ORAL
  Administered 2020-11-25 – 2020-11-26 (×4): 2 via ORAL
  Filled 2020-11-25: qty 2
  Filled 2020-11-25: qty 1
  Filled 2020-11-25 (×4): qty 2

## 2020-11-25 MED ORDER — BUPIVACAINE IN DEXTROSE 0.75-8.25 % IT SOLN
INTRATHECAL | Status: DC | PRN
  Administered 2020-11-25: 1.6 mL via INTRATHECAL

## 2020-11-25 MED ORDER — PHENYLEPHRINE HCL-NACL 10-0.9 MG/250ML-% IV SOLN
INTRAVENOUS | Status: DC | PRN
  Administered 2020-11-25: 25 ug/min via INTRAVENOUS

## 2020-11-25 SURGICAL SUPPLY — 47 items
BAG DECANTER FOR FLEXI CONT (MISCELLANEOUS) IMPLANT
BAG ZIPLOCK 12X15 (MISCELLANEOUS) IMPLANT
BALL HIP CERAMIC (Hips) ×1 IMPLANT
BLADE SAG 18X100X1.27 (BLADE) ×2 IMPLANT
BLADE SURG SZ10 CARB STEEL (BLADE) ×4 IMPLANT
COVER PERINEAL POST (MISCELLANEOUS) ×2 IMPLANT
COVER SURGICAL LIGHT HANDLE (MISCELLANEOUS) ×2 IMPLANT
COVER WAND RF STERILE (DRAPES) IMPLANT
CUP ACET PINNACLE SECTR 50MM (Hips) ×1 IMPLANT
DERMABOND ADVANCED (GAUZE/BANDAGES/DRESSINGS) ×1
DERMABOND ADVANCED .7 DNX12 (GAUZE/BANDAGES/DRESSINGS) ×1 IMPLANT
DRAPE STERI IOBAN 125X83 (DRAPES) ×2 IMPLANT
DRAPE U-SHAPE 47X51 STRL (DRAPES) ×4 IMPLANT
DRESSING AQUACEL AG SP 3.5X10 (GAUZE/BANDAGES/DRESSINGS) ×1 IMPLANT
DRSG AQUACEL AG ADV 3.5X10 (GAUZE/BANDAGES/DRESSINGS) ×2 IMPLANT
DRSG AQUACEL AG SP 3.5X10 (GAUZE/BANDAGES/DRESSINGS) ×2
DURAPREP 26ML APPLICATOR (WOUND CARE) ×2 IMPLANT
ELECT REM PT RETURN 15FT ADLT (MISCELLANEOUS) ×2 IMPLANT
ELIMINATOR HOLE APEX DEPUY (Hips) ×2 IMPLANT
GLOVE BIOGEL PI IND STRL 7.5 (GLOVE) ×1 IMPLANT
GLOVE BIOGEL PI IND STRL 8.5 (GLOVE) ×1 IMPLANT
GLOVE BIOGEL PI INDICATOR 7.5 (GLOVE) ×1
GLOVE BIOGEL PI INDICATOR 8.5 (GLOVE) ×1
GLOVE ECLIPSE 8.0 STRL XLNG CF (GLOVE) ×4 IMPLANT
GLOVE ORTHO TXT STRL SZ7.5 (GLOVE) ×4 IMPLANT
GLOVE SURG ENC MOIS LTX SZ6 (GLOVE) ×4 IMPLANT
GLOVE SURG UNDER POLY LF SZ6.5 (GLOVE) ×2 IMPLANT
GOWN STRL REUS W/TWL LRG LVL3 (GOWN DISPOSABLE) ×4 IMPLANT
GOWN STRL REUS W/TWL XL LVL3 (GOWN DISPOSABLE) ×2 IMPLANT
HIP BALL CERAMIC (Hips) ×2 IMPLANT
HOLDER FOLEY CATH W/STRAP (MISCELLANEOUS) ×2 IMPLANT
KIT TURNOVER KIT A (KITS) ×2 IMPLANT
LINER ACET PNNCL PLUS4 NEUTRAL (Hips) ×1 IMPLANT
PACK ANTERIOR HIP CUSTOM (KITS) ×2 IMPLANT
PENCIL SMOKE EVACUATOR (MISCELLANEOUS) ×2 IMPLANT
PINNACLE PLUS 4 NEUTRAL (Hips) ×2 IMPLANT
PINNACLE SECTOR CUP 50MM (Hips) ×2 IMPLANT
SCREW 6.5MMX30MM (Screw) ×2 IMPLANT
STEM FEMORAL SZ5 HIGH ACTIS (Stem) ×2 IMPLANT
SUT MNCRL AB 4-0 PS2 18 (SUTURE) ×2 IMPLANT
SUT STRATAFIX 0 PDS 27 VIOLET (SUTURE) ×2
SUT VIC AB 1 CT1 36 (SUTURE) ×6 IMPLANT
SUT VIC AB 2-0 CT1 27 (SUTURE) ×4
SUT VIC AB 2-0 CT1 TAPERPNT 27 (SUTURE) ×2 IMPLANT
SUTURE STRATFX 0 PDS 27 VIOLET (SUTURE) ×1 IMPLANT
TRAY FOLEY MTR SLVR 14FR STAT (SET/KITS/TRAYS/PACK) ×2 IMPLANT
WATER STERILE IRR 1000ML POUR (IV SOLUTION) ×2 IMPLANT

## 2020-11-25 NOTE — Anesthesia Procedure Notes (Signed)
Spinal  Patient location during procedure: OR Start time: 11/25/2020 11:14 AM End time: 11/25/2020 11:17 AM Staffing Performed: anesthesiologist  Anesthesiologist: Mellody Dance, MD Preanesthetic Checklist Completed: patient identified, IV checked, risks and benefits discussed, surgical consent, monitors and equipment checked, pre-op evaluation and timeout performed Spinal Block Patient position: sitting Prep: DuraPrep Patient monitoring: cardiac monitor, continuous pulse ox and blood pressure Approach: midline Location: L3-4 Injection technique: single-shot Needle Needle type: Pencan  Needle gauge: 24 G Needle length: 9 cm Additional Notes Functioning IV was confirmed and monitors were applied. Sterile prep and drape, including hand hygiene and sterile gloves were used. The patient was positioned and the spine was prepped. The skin was anesthetized with lidocaine.  Free flow of clear CSF was obtained prior to injecting local anesthetic into the CSF.  The spinal needle aspirated freely following injection.  The needle was carefully withdrawn.  The patient tolerated the procedure well. 2 attempts by CRNA Anastasio Champion, 1 attempt by me. Easy placement. Tanna Furry, MD

## 2020-11-25 NOTE — Anesthesia Postprocedure Evaluation (Signed)
Anesthesia Post Note  Patient: [name]  Procedure(s) Performed: TOTAL HIP ARTHROPLASTY ANTERIOR APPROACH (Left Hip)     Patient location during evaluation: PACU Anesthesia Type: Spinal Level of consciousness: awake and alert, patient cooperative and oriented Pain management: pain level controlled Vital Signs Assessment: post-procedure vital signs reviewed and stable Respiratory status: spontaneous breathing, nonlabored ventilation and respiratory function stable Cardiovascular status: blood pressure returned to baseline and stable Postop Assessment: no apparent nausea or vomiting, spinal receding and patient able to bend at knees Anesthetic complications: no   No complications documented.  Last Vitals:  Vitals:   11/25/20 1400 11/25/20 1419  BP:  (!) 122/54  Pulse: (!) 51 (!) 56  Resp: 19 17  Temp: 36.4 C   SpO2: 100% 97%    Last Pain:  Vitals:   11/25/20 1419  TempSrc:   PainSc: 5                  Sevon Rotert,E. Brinlyn Cena

## 2020-11-25 NOTE — Transfer of Care (Signed)
Immediate Anesthesia Transfer of Care Note  Patient: [name]  Procedure(s) Performed: TOTAL HIP ARTHROPLASTY ANTERIOR APPROACH (Left Hip)  Patient Location: PACU  Anesthesia Type:Spinal  Level of Consciousness: awake, alert , oriented and patient cooperative  Airway & Oxygen Therapy: Patient Spontanous Breathing and Patient connected to face mask oxygen  Post-op Assessment: Report given to RN, Post -op Vital signs reviewed and stable and Patient moving all extremities X 4  Post vital signs: stable  Last Vitals:  Vitals Value Taken Time  BP 99/56 11/25/20 1301  Temp    Pulse 61 11/25/20 1303  Resp 17 11/25/20 1303  SpO2 100 % 11/25/20 1303  Vitals shown include unvalidated device data.  Last Pain:  Vitals:   11/25/20 0859  TempSrc: Oral      Patients Stated Pain Goal: 4 (11/25/20 0926)  Complications: No complications documented.

## 2020-11-25 NOTE — Interval H&P Note (Signed)
History and Physical Interval Note:  11/25/2020 10:03 AM  [name]  has presented today for surgery, with the diagnosis of Left hip osteoarthritis.  The various methods of treatment have been discussed with the patient and family. After consideration of risks, benefits and other options for treatment, the patient has consented to  Procedure(s) with comments: TOTAL HIP ARTHROPLASTY ANTERIOR APPROACH (Left) - 70 mins as a surgical intervention.  The patient's history has been reviewed, patient examined, no change in status, stable for surgery.  I have reviewed the patient's chart and labs.  Questions were answered to the patient's satisfaction.     Shelda Pal

## 2020-11-25 NOTE — Op Note (Signed)
NAME:  [name]                ACCOUNT NO.: [id number]      MEDICAL RECORD NO.: [id number]      FACILITY:  [facility]      PHYSICIAN:  Mauri Pole  DATE OF BIRTH:  [birth year]     DATE OF PROCEDURE:  11/25/2020                                 OPERATIVE REPORT         PREOPERATIVE DIAGNOSIS: Left  hip osteoarthritis.      POSTOPERATIVE DIAGNOSIS:  Left hip osteoarthritis.      PROCEDURE:  Left total hip replacement through an anterior approach   utilizing DePuy THR system, component size 50 mm pinnacle cup, a size 32+4 neutral   Altrex liner, a size 5 Hi Actis stem with a 32+5 delta ceramic ball.      SURGEON:  Pietro Cassis. Alvan Dame, M.D.      ASSISTANT:  Griffith Citron, PA-C     ANESTHESIA:  Spinal.      SPECIMENS:  None.      COMPLICATIONS:  None.      BLOOD LOSS:  150 cc     DRAINS:  None.      INDICATION OF THE PROCEDURE:  [name] is a 69 y.o. female who had   presented to office for evaluation of left hip pain.  Radiographs revealed   progressive degenerative changes with bone-on-bone   articulation of the  hip joint, including subchondral cystic changes and osteophytes.  The patient had painful limited range of   motion significantly affecting their overall quality of life and function.  The patient was failing to    respond to conservative measures including medications and/or injections and activity modification and at this point was ready   to proceed with more definitive measures.  Consent was obtained for   benefit of pain relief.  Specific risks of infection, DVT, component   failure, dislocation, neurovascular injury, and need for revision surgery were reviewed in the office as well discussion of   the anterior versus posterior approach were reviewed.     PROCEDURE IN DETAIL:  The patient was brought to operative theater.   Once adequate anesthesia, preoperative antibiotics, 2 gm of Ancef, 1 gm of Tranexamic Acid, and 10 mg  of Decadron were administered, the patient was positioned supine on the [employer] table.  Once the patient was safely positioned with adequate padding of boney prominences we predraped out the hip, and used fluoroscopy to confirm orientation of the pelvis.      The left hip was then prepped and draped from proximal iliac crest to   mid thigh with a shower curtain technique.      Time-out was performed identifying the patient, planned procedure, and the appropriate extremity.     An incision was then made 2 cm lateral to the   anterior superior iliac spine extending over the orientation of the   tensor fascia lata muscle and sharp dissection was carried down to the   fascia of the muscle.      The fascia was then incised.  The muscle belly was identified and swept   laterally and retractor placed along the superior neck.  Following   cauterization of the circumflex vessels and removing some pericapsular  fat, a second cobra retractor was placed on the inferior neck.  A T-capsulotomy was made along the line of the   superior neck to the trochanteric fossa, then extended proximally and   distally.  Tag sutures were placed and the retractors were then placed   intracapsular.  We then identified the trochanteric fossa and   orientation of my neck cut and then made a neck osteotomy with the femur on traction.  The femoral   head was removed without difficulty or complication.  Traction was let   off and retractors were placed posterior and anterior around the   acetabulum.      The labrum and foveal tissue were debrided.  I began reaming with a 45 mm   reamer and reamed up to 49 mm reamer with good bony bed preparation and a 50 mm  cup was chosen.  The final 50 mm Pinnacle cup was then impacted under fluoroscopy to confirm the depth of penetration and orientation with respect to   Abduction and forward flexion.  A screw was placed into the ilium followed by the hole eliminator.  The final    32+4 neutral Altrex liner was impacted with good visualized rim fit.  The cup was positioned anatomically within the acetabular portion of the pelvis.      At this point, the femur was rolled to 100 degrees.  Further capsule was   released off the inferior aspect of the femoral neck.  I then   released the superior capsule proximally.  With the leg in a neutral position the hook was placed laterally   along the femur under the vastus lateralis origin and elevated manually and then held in position using the hook attachment on the bed.  The leg was then extended and adducted with the leg rolled to 100   degrees of external rotation.  Retractors were placed along the medial calcar and posteriorly over the greater trochanter.  Once the proximal femur was fully   exposed, I used a box osteotome to set orientation.  I then began   broaching with the starting chili pepper broach and passed this by hand and then broached up to 5.  With the 5 broach in place I chose a high offset neck and did several trial reductions.  The offset was appropriate, leg lengths   appeared to be equal best matched with the +5 head ball trial confirmed radiographically.   Given these findings, I went ahead and dislocated the hip, repositioned all   retractors and positioned the right hip in the extended and abducted position.  The final 5 Hi Actis stem was   chosen and it was impacted down to the level of neck cut.  Based on this   and the trial reductions, a final 32+5 delta ceramic ball was chosen and   impacted onto a clean and dry trunnion, and the hip was reduced.  The   hip had been irrigated throughout the case again at this point.  I did   reapproximate the superior capsular leaflet to the anterior leaflet   using #1 Vicryl.  The fascia of the   tensor fascia lata muscle was then reapproximated using #1 Vicryl and #0 Stratafix sutures.  The   remaining wound was closed with 2-0 Vicryl and running 4-0 Monocryl.   The  hip was cleaned, dried, and dressed sterilely using Dermabond and   Aquacel dressing.  The patient was then brought   to recovery room in  stable condition tolerating the procedure well.    Dennie Bible, PA-C was present for the entirety of the case involved from   preoperative positioning, perioperative retractor management, general   facilitation of the case, as well as primary wound closure as assistant.            Madlyn Frankel Charlann Boxer, M.D.        11/25/2020 12:39 PM

## 2020-11-25 NOTE — Evaluation (Signed)
Physical Therapy Evaluation Patient Details Name: [name] MRN: [id number] DOB: [birth year] Today's Date: 11/25/2020   History of Present Illness  Patient is 69 y.o. female s/p Lt THA anterior approach on 11/25/20 with PMH significant for OA, hypothyroidism, cervical cancer.    Clinical Impression  ONYA OREFICE is a 69 y.o. female POD 0 s/p Lt THA. Patient reports independence with mobility at baseline. Patient is now limited by functional impairments (see PT problem list below) and requires min assist for transfers and gait with RW. Patient was able to ambulate ~50 feet with RW and min assist. Patient instructed in exercise to facilitate strength and circulation. Patient will benefit from continued skilled PT interventions to address impairments and progress towards PLOF. Acute PT will follow to progress mobility and stair training in preparation for safe discharge home.     Follow Up Recommendations Follow surgeon's recommendation for DC plan and follow-up therapies    Equipment Recommendations  None recommended by PT    Recommendations for Other Services       Precautions / Restrictions Precautions Precautions: Fall Restrictions Weight Bearing Restrictions: No Other Position/Activity Restrictions: WBAT      Mobility  Bed Mobility Overal bed mobility: Needs Assistance Bed Mobility: Supine to Sit     Supine to sit: HOB elevated;Min assist     General bed mobility comments: cues to use bed rail and assist for Lt LE off EOB.    Transfers Overall transfer level: Needs assistance Equipment used: Rolling walker (2 wheeled) Transfers: Sit to/from Stand Sit to Stand: Min assist         General transfer comment: cues for technique with use of RW, assist to initiate and complete rise.  Ambulation/Gait Ambulation/Gait assistance: Min assist Gait Distance (Feet): 50 Feet Assistive device: Rolling walker (2 wheeled) Gait Pattern/deviations: Step-to  pattern;Decreased stride length;Decreased weight shift to left Gait velocity: decr   General Gait Details: Cues for step pattern and assist to maintain safe proximity to RW throughout. min assist to steady, pt wtih some hip extensor weakness.  Stairs            Wheelchair Mobility    Modified Rankin (Stroke Patients Only)       Balance Overall balance assessment: Needs assistance Sitting-balance support: Feet supported Sitting balance-Leahy Scale: Good     Standing balance support: During functional activity;Bilateral upper extremity supported Standing balance-Leahy Scale: Poor                               Pertinent Vitals/Pain Pain Assessment: 0-10 Pain Score: 5  Pain Location: Lt hip Pain Descriptors / Indicators: Aching;Burning;Discomfort Pain Intervention(s): Limited activity within patient's tolerance;Monitored during session;Repositioned;Patient requesting pain meds-RN notified;Ice applied    Home Living Family/patient expects to be discharged to:: Private residence Living Arrangements: Spouse/significant other Available Help at Discharge: Family Type of Home: House Home Access: Stairs to enter Entrance Stairs-Rails: Can reach both Entrance Stairs-Number of Steps: 2 Home Layout: One level [place] - single point;Walker - 2 wheels;Wheelchair - manual;Grab bars - tub/shower;Bedside commode;Walker - 4 wheels      Prior Function Level of Independence: Independent         Comments: has been using [employer] for last couple weeks for long distances     Hand Dominance   Dominant Hand: Right    Extremity/Trunk Assessment   Upper Extremity Assessment Upper Extremity Assessment: Overall WFL for tasks assessed  Lower Extremity Assessment Lower Extremity Assessment: Overall WFL for tasks assessed    Cervical / Trunk Assessment Cervical / Trunk Assessment: Normal  Communication   Communication: No difficulties  Cognition  Arousal/Alertness: Awake/alert Behavior During Therapy: WFL for tasks assessed/performed Overall Cognitive Status: Within Functional Limits for tasks assessed                                        General Comments      Exercises Total Joint Exercises Ankle Circles/Pumps: AROM;Both;20 reps;Seated Quad Sets: AROM;Left;5 reps;Seated   Assessment/Plan    PT Assessment Patient needs continued PT services  PT Problem List Decreased strength;Decreased range of motion;Decreased activity tolerance;Decreased balance;Decreased mobility;Decreased knowledge of use of DME;Decreased knowledge of precautions;Pain       PT Treatment Interventions DME instruction;Gait training;Stair training;Functional mobility training;Therapeutic activities;Therapeutic exercise;Balance training;Patient/family education    PT Goals (Current goals can be found in the Care Plan section)  Acute Rehab PT Goals Patient Stated Goal: regain independence PT Goal Formulation: With patient Time For Goal Achievement: 12/02/20 Potential to Achieve Goals: Good    Frequency 7X/week   Barriers to discharge        Co-evaluation               AM-PAC PT "6 Clicks" Mobility  Outcome Measure Help needed turning from your back to your side while in a flat bed without using bedrails?: A Little Help needed moving from lying on your back to sitting on the side of a flat bed without using bedrails?: A Little Help needed moving to and from a bed to a chair (including a wheelchair)?: A Little Help needed standing up from a chair using your arms (e.g., wheelchair or bedside chair)?: A Little Help needed to walk in hospital room?: A Little Help needed climbing 3-5 steps with a railing? : A Lot 6 Click Score: 17    End of Session Equipment Utilized During Treatment: Gait belt Activity Tolerance: Patient tolerated treatment well Patient left: in chair;with call bell/phone within reach;with chair alarm  set;with family/visitor present Nurse Communication: Mobility status;Patient requests pain meds PT Visit Diagnosis: Muscle weakness (generalized) (M62.81);Difficulty in walking, not elsewhere classified (R26.2);Pain Pain - Right/Left: Left Pain - part of body: Hip    Time: 2637-8588 PT Time Calculation (min) (ACUTE ONLY): 18 min   Charges:   PT Evaluation $PT Eval Low Complexity: 1 Low          Wynn Maudlin, DPT Acute Rehabilitation Services Office [phone] Pager [phone]    Anitra Lauth 11/25/2020, 4:40 PM

## 2020-11-25 NOTE — Discharge Instructions (Signed)

## 2020-11-25 NOTE — Anesthesia Preprocedure Evaluation (Addendum)
Anesthesia Evaluation  Patient identified by MRN, date of birth, ID band Patient awake    Reviewed: Allergy & Precautions, NPO status , Patient's Chart, lab work & pertinent test results  History of Anesthesia Complications (+) PONV  Airway Mallampati: II  TM Distance: >3 FB Neck ROM: Full    Dental  (+) Dental Advisory Given   Pulmonary former smoker,  11/24/2020 SARS coronavirus NEG   breath sounds clear to auscultation       Cardiovascular (-) hypertensionnegative cardio ROS   Rhythm:Regular Rate:Normal     Neuro/Psych negative neurological ROS     GI/Hepatic negative GI ROS,   Endo/Other  Hypothyroidism   Renal/GU negative Renal ROS   H/o cervical cancer    Musculoskeletal  (+) Arthritis ,   Abdominal   Peds  Hematology negative hematology ROS (+)   Anesthesia Other Findings   Reproductive/Obstetrics                            Anesthesia Physical Anesthesia Plan  ASA: II  Anesthesia Plan: Spinal   Post-op Pain Management:    Induction:   PONV Risk Score and Plan: 3 and Ondansetron  Airway Management Planned: Natural Airway and Simple Face Mask  Additional Equipment: None  Intra-op Plan:   Post-operative Plan:   Informed Consent: I have reviewed the patients History and Physical, chart, labs and discussed the procedure including the risks, benefits and alternatives for the proposed anesthesia with the patient or authorized representative who has indicated his/her understanding and acceptance.     Dental advisory given  Plan Discussed with: CRNA and Surgeon  Anesthesia Plan Comments:         Anesthesia Quick Evaluation

## 2020-11-25 NOTE — Anesthesia Procedure Notes (Signed)
Procedure Name: MAC Date/Time: 11/25/2020 11:04 AM Performed by: Lissa Morales, CRNA Pre-anesthesia Checklist: Patient identified, Emergency Drugs available, Suction available and Patient being monitored Patient Re-evaluated:Patient Re-evaluated prior to induction Oxygen Delivery Method: Simple face mask Placement Confirmation: positive ETCO2

## 2020-11-26 ENCOUNTER — Encounter (HOSPITAL_COMMUNITY): Payer: Self-pay | Admitting: Orthopedic Surgery

## 2020-11-26 DIAGNOSIS — M1612 Unilateral primary osteoarthritis, left hip: Secondary | ICD-10-CM | POA: Diagnosis not present

## 2020-11-26 LAB — CBC
HCT: 34.1 % — ABNORMAL LOW (ref 36.0–46.0)
Hemoglobin: 11.2 g/dL — ABNORMAL LOW (ref 12.0–15.0)
MCH: 30.8 pg (ref 26.0–34.0)
MCHC: 32.8 g/dL (ref 30.0–36.0)
MCV: 93.7 fL (ref 80.0–100.0)
Platelets: 312 10*3/uL (ref 150–400)
RBC: 3.64 MIL/uL — ABNORMAL LOW (ref 3.87–5.11)
RDW: 11.5 % (ref 11.5–15.5)
WBC: 10.4 10*3/uL (ref 4.0–10.5)
nRBC: 0 % (ref 0.0–0.2)

## 2020-11-26 LAB — BASIC METABOLIC PANEL
Anion gap: 9 (ref 5–15)
BUN: 11 mg/dL (ref 8–23)
CO2: 23 mmol/L (ref 22–32)
Calcium: 8.6 mg/dL — ABNORMAL LOW (ref 8.9–10.3)
Chloride: 105 mmol/L (ref 98–111)
Creatinine, Ser: 0.67 mg/dL (ref 0.44–1.00)
GFR, Estimated: >60 mL/min (ref >60)
Glucose, Bld: 138 mg/dL — ABNORMAL HIGH (ref 70–99)
Potassium: 4.2 mmol/L (ref 3.5–5.1)
Sodium: 137 mmol/L (ref 135–145)

## 2020-11-26 MED ORDER — METHOCARBAMOL 500 MG PO TABS: 500.0000 mg | ORAL_TABLET | Freq: Four times a day (QID) | ORAL | 0 refills | Status: AC | PRN

## 2020-11-26 MED ORDER — HYDROCODONE-ACETAMINOPHEN 5-325 MG PO TABS: 1.0000 | ORAL_TABLET | ORAL | 0 refills | Status: AC | PRN

## 2020-11-26 MED ORDER — ASPIRIN 81 MG PO CHEW: 81.0000 mg | CHEWABLE_TABLET | Freq: Two times a day (BID) | ORAL | 0 refills | Status: AC

## 2020-11-26 MED ORDER — ONDANSETRON HCL 4 MG PO TABS: 4.0000 mg | ORAL_TABLET | Freq: Four times a day (QID) | ORAL | 0 refills | Status: AC | PRN

## 2020-11-26 NOTE — Progress Notes (Signed)
Patient discharged with belongings and instructions. Patient states that she spoke to SW and doesn't need any DME equipment.

## 2020-11-26 NOTE — Progress Notes (Signed)
   11/26/20 1300  PT Visit Information  Last PT Received On 11/26/20  Assistance Needed +1  Pt continue to progress and meet PT goals. See below for details. Ready for d/c with family assist from PT standpoint.  History of Present Illness Patient is 69 y.o. female s/p Lt THA anterior approach on 11/25/20 with PMH significant for OA, hypothyroidism, cervical cancer.  Subjective Data  Patient Stated Goal regain independence  Precautions  Precautions Fall  Restrictions  Other Position/Activity Restrictions WBAT  Pain Assessment  Pain Assessment 0-10  Pain Score 2  Pain Location Lt hip  Pain Descriptors / Indicators Aching;Burning;Discomfort  Pain Intervention(s) Limited activity within patient's tolerance;Monitored during session;Premedicated before session;Repositioned  Cognition  Arousal/Alertness Awake/alert  Behavior During Therapy WFL for tasks assessed/performed  Overall Cognitive Status Within Functional Limits for tasks assessed  Bed Mobility  General bed mobility comments in chair  Transfers  Overall transfer level Needs assistance  Equipment used Rolling walker (2 wheeled)  Transfers Sit to/from Stand  Sit to Stand Min guard  General transfer comment cues for hand placement and overall safety  Ambulation/Gait  Ambulation/Gait assistance Supervision  Gait Distance (Feet) 130 Feet  Assistive device Rolling walker (2 wheeled)  Gait Pattern/deviations Step-to pattern;Decreased weight shift to left;Step-through pattern;Decreased stride length  General Gait Details cues for incr wt shift to LLE, stride length/gait progression. steady gait, no dizziness  Stairs Yes  Stairs assistance Min guard  Stair Management Two rails;Step to pattern;Forwards  Number of Stairs 3  General stair comments cues for sequence  Balance  Sitting balance-Leahy Scale Good  Standing balance-Leahy Scale Fair  PT - End of Session  Equipment Utilized During Treatment Gait belt  Activity Tolerance  Patient tolerated treatment well  Patient left in chair;with call bell/phone within reach;with chair alarm set;with family/visitor present   PT - Assessment/Plan  PT Plan Current plan remains appropriate  PT Visit Diagnosis Muscle weakness (generalized) (M62.81);Difficulty in walking, not elsewhere classified (R26.2);Pain  Pain - Right/Left Left  Pain - part of body Hip  PT Frequency (ACUTE ONLY) 7X/week  Follow Up Recommendations Follow surgeon's recommendation for DC plan and follow-up therapies  PT equipment None recommended by PT  AM-PAC PT "6 Clicks" Mobility Outcome Measure (Version 2)  Help needed turning from your back to your side while in a flat bed without using bedrails? 3  Help needed moving from lying on your back to sitting on the side of a flat bed without using bedrails? 3  Help needed moving to and from a bed to a chair (including a wheelchair)? 3  Help needed standing up from a chair using your arms (e.g., wheelchair or bedside chair)? 3  Help needed to walk in hospital room? 3  Help needed climbing 3-5 steps with a railing?  3  6 Click Score 18  Consider Recommendation of Discharge To: Home with [facility]  Acute Rehab PT Goals  PT Goal Formulation With patient  Time For Goal Achievement 12/02/20  Potential to Achieve Goals Good  PT Time Calculation  PT Start Time (ACUTE ONLY) 1318  PT Stop Time (ACUTE ONLY) 1333  PT Time Calculation (min) (ACUTE ONLY) 15 min  PT General Charges  $$ ACUTE PT VISIT 1 Visit  PT Treatments  $Gait Training 8-22 mins

## 2020-11-26 NOTE — Plan of Care (Signed)
Pt discharged all care plans met

## 2020-11-26 NOTE — TOC Transition Note (Signed)
Transition of Care [facility]) - CM/SW Discharge Note   Patient Details  Name: [name] MRN: [id number] Date of Birth: 02-19-1951  [facility] of [facility]) CM/SW Contact:  Lennart Pall, LCSW Phone Number: 11/26/2020, 10:47 AM   Clinical Narrative:     Met briefly with pt and spouse.  Confirming plan for HEP and pt already has needed DME.  No TOC needs.  Final next level of care: Home/Self Care Barriers to Discharge: No Barriers Identified   Patient Goals and CMS Choice        Discharge Placement                       Discharge Plan and Services                DME Arranged: N/A DME Agency: NA                  Social Determinants of Health (SDOH) Interventions     Readmission Risk Interventions No flowsheet data found.

## 2020-11-26 NOTE — Plan of Care (Signed)
  Problem: Activity: Goal: Ability to avoid complications of mobility impairment will improve Outcome: Progressing   Problem: Pain Management: Goal: Pain level will decrease with appropriate interventions Outcome: Progressing   Problem: Education: Goal: Knowledge of the prescribed therapeutic regimen will improve Outcome: Progressing

## 2020-11-26 NOTE — Progress Notes (Signed)
Physical Therapy Treatment Patient Details Name: [name] MRN: [id number] DOB: 1951-02-05 Today's Date: 11/26/2020    History of Present Illness Patient is 69 y.o. female s/p Lt THA anterior approach on 11/25/20 with PMH significant for OA, hypothyroidism, cervical cancer.    PT Comments    Pt  Progressing well. Will see again this pm and should be ready to d/c later this afternoon   Follow Up Recommendations  Follow surgeon's recommendation for DC plan and follow-up therapies     Equipment Recommendations  None recommended by PT    Recommendations for Other Services       Precautions / Restrictions Precautions Precautions: Fall Restrictions Weight Bearing Restrictions: No Other Position/Activity Restrictions: WBAT    Mobility  Bed Mobility               General bed mobility comments: in chair  Transfers Overall transfer level: Needs assistance Equipment used: Rolling walker (2 wheeled) Transfers: Sit to/from Stand Sit to Stand: Min guard         General transfer comment: cues for hand placement and overall safety  Ambulation/Gait Ambulation/Gait assistance: Min guard Gait Distance (Feet): 160 Feet Assistive device: Rolling walker (2 wheeled) Gait Pattern/deviations: Step-to pattern;Decreased weight shift to left;Step-through pattern;Decreased stride length     General Gait Details: cues for incr wt shift to LLE, stride length/gait progression. steady gait, no dizziness   Stairs             Wheelchair Mobility    Modified Rankin (Stroke Patients Only)       Balance     Sitting balance-Leahy Scale: Good       Standing balance-Leahy Scale: Fair                              Cognition Arousal/Alertness: Awake/alert Behavior During Therapy: WFL for tasks assessed/performed Overall Cognitive Status: Within Functional Limits for tasks assessed                                        Exercises  Total Joint Exercises Ankle Circles/Pumps: AROM;Both;20 reps Quad Sets: AROM;Both;10 reps Heel Slides: AAROM;Left;10 reps Hip ABduction/ADduction: AAROM;Left;10 reps Long Arc Quad: AROM;Left;10 reps;Seated    General Comments        Pertinent Vitals/Pain Pain Assessment: 0-10 Pain Score: 3  Pain Location: Lt hip Pain Descriptors / Indicators: Aching;Burning;Discomfort Pain Intervention(s): Limited activity within patient's tolerance;Monitored during session;Premedicated before session;Repositioned    Home Living                      Prior Function            PT Goals (current goals can now be found in the care plan section) Acute Rehab PT Goals Patient Stated Goal: regain independence PT Goal Formulation: With patient Time For Goal Achievement: 12/02/20 Potential to Achieve Goals: Good Progress towards PT goals: Progressing toward goals    Frequency    7X/week      PT Plan Current plan remains appropriate    Co-evaluation              AM-PAC PT "6 Clicks" Mobility   Outcome Measure  Help needed turning from your back to your side while in a flat bed without using bedrails?: A Little Help needed moving from lying on your back to  sitting on the side of a flat bed without using bedrails?: A Little Help needed moving to and from a bed to a chair (including a wheelchair)?: A Little Help needed standing up from a chair using your arms (e.g., wheelchair or bedside chair)?: A Little Help needed to walk in hospital room?: A Little Help needed climbing 3-5 steps with a railing? : A Little 6 Click Score: 18    End of Session Equipment Utilized During Treatment: Gait belt Activity Tolerance: Patient tolerated treatment well Patient left: in chair;with call bell/phone within reach;with chair alarm set;with family/visitor present   PT Visit Diagnosis: Muscle weakness (generalized) (M62.81);Difficulty in walking, not elsewhere classified (R26.2);Pain Pain  - Right/Left: Left Pain - part of body: Hip     Time: 1028-1100 PT Time Calculation (min) (ACUTE ONLY): 32 min  Charges:  $Gait Training: 8-22 mins $Therapeutic Exercise: 8-22 mins                     Delice Bison, PT  Acute Rehab Dept ([facility]) [phone] Pager [phone]  11/26/2020    [facility] 11/26/2020, 11:08 AM

## 2020-11-26 NOTE — Progress Notes (Signed)
   Subjective: 1 Day Post-Op Procedure(s) (LRB): TOTAL HIP ARTHROPLASTY ANTERIOR APPROACH (Left) Patient reports pain as mild.   Patient seen in rounds by Dr. Charlann Boxer. Patient is well, and has had no acute complaints or problems We will start therapy today.   Objective: Vital signs in last 24 hours: Temp:  [97.5 F (36.4 C)-99 F (37.2 C)] 98.1 F (36.7 C) (12/29 0615) Pulse Rate:  [51-73] 60 (12/29 0615) Resp:  [13-20] 16 (12/29 0615) BP: (89-133)/(48-82) 113/82 (12/29 0615) SpO2:  [95 %-100 %] 95 % (12/29 0615) Weight:  [67.1 kg] 67.1 kg (12/28 0926)  Intake/Output from previous day:  Intake/Output Summary (Last 24 hours) at 11/26/2020 0734 Last data filed at 11/26/2020 9242 Gross per 24 hour  Intake 4909.67 ml  Output 3275 ml  Net 1634.67 ml     Intake/Output this shift: No intake/output data recorded.  Labs: Recent Labs    11/26/20 0254  HGB 11.2*   Recent Labs    11/26/20 0254  WBC 10.4  RBC 3.64*  HCT 34.1*  PLT 312   Recent Labs    11/26/20 0254  NA 137  K 4.2  CL 105  CO2 23  BUN 11  CREATININE 0.67  GLUCOSE 138*  CALCIUM 8.6*   No results for input(s): LABPT, INR in the last 72 hours.  Exam: General - Patient is Alert and Oriented Extremity - Neurologically intact Sensation intact distally Intact pulses distally Dorsiflexion/Plantar flexion intact Dressing - dressing C/D/I Motor Function - intact, moving foot and toes well on exam.   Past Medical History:  Diagnosis Date  . Anginal pain (HCC) 2016   Due to Job stress  . Arthritis    Back, hips, knees, hands  . Cancer (HCC) 1977   cervical  . Complication of anesthesia   . Hypothyroidism   . PONV (postoperative nausea and vomiting)     Assessment/Plan: 1 Day Post-Op Procedure(s) (LRB): TOTAL HIP ARTHROPLASTY ANTERIOR APPROACH (Left) Active Problems:   S/P left THA, AA  Estimated body mass index is 23.53 kg/m as calculated from the following:   Height as of this  encounter: 5' 6.5" (1.689 m).   Weight as of this encounter: 67.1 kg. Advance diet Up with therapy D/C IV fluids  DVT Prophylaxis - Aspirin Weight bearing as tolerated.  Plan is to go Home after hospital stay. Plan for discharge home today following 1-2 sessions of therapy as long as she is meeting her goals. Follow up in the office in 2 weeks.   Dennie Bible, PA-C Orthopedic Surgery [phone] 11/26/2020, 7:34 AM

## 2020-12-01 NOTE — Discharge Summary (Signed)
Physician Discharge Summary   Patient ID: [name] MRN: [id number] DOB/AGE: [birth year] 69 y.o.  Admit date: 11/25/2020 Discharge date: 11/26/2020  Primary Diagnosis: Left  hip osteoarthritis.   Admission Diagnoses:  Past Medical History:  Diagnosis Date  . Anginal pain (HCC) 2016   Due to Job stress  . Arthritis    Back, hips, knees, hands  . Cancer (HCC) 1977   cervical  . Complication of anesthesia   . Hypothyroidism   . PONV (postoperative nausea and vomiting)    Discharge Diagnoses:   Active Problems:   S/P left THA, AA  Estimated body mass index is 23.53 kg/m as calculated from the following:   Height as of this encounter: 5' 6.5" (1.689 m).   Weight as of this encounter: 67.1 kg.  Procedure:  Procedure(s) (LRB): TOTAL HIP ARTHROPLASTY ANTERIOR APPROACH (Left)   Consults: None  HPI: [name] is a 70 y.o. female who had   presented to office for evaluation of left hip pain.  Radiographs revealed   progressive degenerative changes with bone-on-bone   articulation of the  hip joint, including subchondral cystic changes and osteophytes.  The patient had painful limited range of   motion significantly affecting their overall quality of life and function.  The patient was failing to   respond to conservative measures including medications and/or injections and activity modification and at this point was ready   to proceed with more definitive measures.  Consent was obtained for   benefit of pain relief.  Specific risks of infection, DVT, component   failure, dislocation, neurovascular injury, and need for revision surgery were reviewed in the office as well discussion of   the anterior versus posterior approach were reviewed.      Laboratory Data: Admission on 11/25/2020, Discharged on 11/26/2020  Component Date Value Ref Range Status  . ABO/RH(D) 11/25/2020    Final                   Value:O NEG Performed at [facility], [address] [place], [place] [address]   . WBC 11/26/2020 10.4  4.0 - 10.5 K/uL Final  . RBC 11/26/2020 3.64* 3.87 - 5.11 MIL/uL Final  . Hemoglobin 11/26/2020 11.2* 12.0 - 15.0 g/dL Final  . HCT 19/14/7829 34.1* 36.0 - 46.0 % Final  . MCV 11/26/2020 93.7  80.0 - 100.0 fL Final  . MCH 11/26/2020 30.8  26.0 - 34.0 pg Final  . MCHC 11/26/2020 32.8  30.0 - 36.0 g/dL Final  . RDW 56/21/3086 11.5  11.5 - 15.5 % Final  . Platelets 11/26/2020 312  150 - 400 K/uL Final  . nRBC 11/26/2020 0.0  0.0 - 0.2 % Final   Performed at [facility], [address], [place], [place] [address]  . Sodium 11/26/2020 137  135 - 145 mmol/L Final  . Potassium 11/26/2020 4.2  3.5 - 5.1 mmol/L Final  . Chloride 11/26/2020 105  98 - 111 mmol/L Final  . CO2 11/26/2020 23  22 - 32 mmol/L Final  . Glucose, Bld 11/26/2020 138* 70 - 99 mg/dL Final   Glucose reference range applies only to samples taken after fasting for at least 8 hours.  . BUN 11/26/2020 11  8 - 23 mg/dL Final  . Creatinine, Ser 11/26/2020 0.67  0.44 - 1.00 mg/dL Final  . Calcium 96/29/5284 8.6* 8.9 - 10.3 mg/dL Final  . GFR, Estimated 11/26/2020 >60  >60 mL/min Final  Comment: (NOTE) Calculated using the CKD-EPI Creatinine Equation (2021)   . Anion gap 11/26/2020 9  5 - 15 Final   Performed at [facility], [address] [place], [place] [address]  Hospital Outpatient Visit on 11/24/2020  Component Date Value Ref Range Status  . SARS Coronavirus 2 11/24/2020 NEGATIVE  NEGATIVE Final   Comment: (NOTE) SARS-CoV-2 target nucleic acids are NOT DETECTED.  The SARS-CoV-2 RNA is generally detectable in upper and lower respiratory specimens during the acute phase of infection. Negative results do not preclude SARS-CoV-2 infection, do not rule out co-infections with other pathogens, and should not be used as the sole basis for treatment or other patient management decisions. Negative results must be combined  with clinical observations, patient history, and epidemiological information. The expected result is Negative.  Fact Sheet for Patients: HairSlick.nohttps://www.fda.gov/media/138098/download  Fact Sheet for Healthcare Providers: quierodirigir.comhttps://www.fda.gov/media/138095/download  This test is not yet approved or cleared by the [place] FDA and  has been authorized for detection and/or diagnosis of SARS-CoV-2 by FDA under an Emergency Use Authorization (EUA). This EUA will remain  in effect (meaning this test can be used) for the duration of the COVID-19 declaration under Se                          ction 564(b)(1) of the Act, 21 U.S.C. section 360bbb-3(b)(1), unless the authorization is terminated or revoked sooner.  Performed at [facility], [address] [place], [place] [address]   Hospital Outpatient Visit on 11/18/2020  Component Date Value Ref Range Status  . WBC 11/18/2020 8.5  4.0 - 10.5 K/uL Final  . RBC 11/18/2020 4.22  3.87 - 5.11 MIL/uL Final  . Hemoglobin 11/18/2020 13.0  12.0 - 15.0 g/dL Final  . HCT 82/95/621312/21/2021 40.3  36.0 - 46.0 % Final  . MCV 11/18/2020 95.5  80.0 - 100.0 fL Final  . MCH 11/18/2020 30.8  26.0 - 34.0 pg Final  . MCHC 11/18/2020 32.3  30.0 - 36.0 g/dL Final  . RDW 08/65/784612/21/2021 11.7  11.5 - 15.5 % Final  . Platelets 11/18/2020 370  150 - 400 K/uL Final  . nRBC 11/18/2020 0.0  0.0 - 0.2 % Final   Performed at [facility], [address] [place], [place] [address]  . MRSA, PCR 11/18/2020 NEGATIVE  NEGATIVE Final  . Staphylococcus aureus 11/18/2020 NEGATIVE  NEGATIVE Final   Comment: (NOTE) The Xpert SA Assay (FDA approved for NASAL specimens in patients 69 years of age and older), is one component of a comprehensive surveillance program. It is not intended to diagnose infection nor to guide or monitor treatment. Performed at [facility], [address] [place], [place] [address]   . ABO/RH(D) 11/18/2020 O NEG    Final  . Antibody Screen 11/18/2020 NEG   Final  . Sample Expiration 11/18/2020 11/28/2020,2359   Final  . Extend sample reason 11/18/2020    Final                   Value:NO TRANSFUSIONS OR PREGNANCY IN THE PAST 3 MONTHS Performed at [facility], [address] [place], [place] [address]      X-Rays:DG Pelvis Portable  Result Date: 11/25/2020 CLINICAL DATA:  Status post LEFT hip replacement. EXAM: PORTABLE PELVIS 1-2 VIEWS COMPARISON:  Intraoperative images also on November 25, 2020 FINDINGS: Post LEFT hip total arthroplasty. Anatomic alignment on AP view without  complicating features. Postoperative changes in the soft tissues about the LEFT hip. No acute osseous abnormality. IMPRESSION: Post LEFT total hip arthroplasty without immediate complicating features on AP projection. Electronically Signed   By: Donzetta KohutGeoffrey  Wile M.D.   On: 11/25/2020 14:04   DG C-Arm 1-60 Min-No Report  Result Date: 11/25/2020 Fluoroscopy was utilized by the requesting physician.  No radiographic interpretation.   DG HIP OPERATIVE UNILAT W OR W/O PELVIS LEFT  Result Date: 11/25/2020 CLINICAL DATA:  Left hip surgery. EXAM: OPERATIVE LEFT HIP (WITH PELVIS IF PERFORMED) 4 VIEWS TECHNIQUE: Fluoroscopic spot image(s) were submitted for interpretation post-operatively. COMPARISON:  11/04/2020. FINDINGS: Total left hip replacement.  Hardware intact.  Anatomic alignment. IMPRESSION: Total left hip replacement with anatomic alignment. Electronically Signed   By: Maisie Fushomas  Register   On: 11/25/2020 12:45    EKG:No orders found for this or any previous visit.   Hospital Course: Eliberto IvoryDeborah A Weingarten is a 70 y.o. who was [facility] to [facility]. They were brought to the operating room on 11/25/2020 and underwent Procedure(s): TOTAL HIP ARTHROPLASTY ANTERIOR APPROACH.  Patient tolerated the procedure well and was later transferred to the recovery room and then to the orthopaedic floor for postoperative  care. They were given PO and IV analgesics for pain control following their surgery. They were given 24 hours of postoperative antibiotics of  Anti-infectives (From admission, onward)   Start     Dose/Rate Route Frequency Ordered Stop   11/25/20 1700  ceFAZolin (ANCEF) IVPB 2g/100 mL premix        2 g 200 mL/hr over 30 Minutes Intravenous Every 6 hours 11/25/20 1409 11/26/20 0013   11/25/20 0900  ceFAZolin (ANCEF) IVPB 2g/100 mL premix        2 g 200 mL/hr over 30 Minutes Intravenous On call to O.R. 11/25/20 [id number] 11/25/20 1109     and started on DVT prophylaxis in the form of Aspirin.   PT and OT were ordered for total joint protocol. Discharge planning consulted to help with postop disposition and equipment needs.  Patient had a good night on the evening of surgery. They started to get up OOB with therapy on POD #0. Pt was seen during rounds and was ready to go home pending progress with therapy. She worked with therapy on POD #1 and was meeting her goals. Pt was discharged to home later that day in stable condition.  Diet: Regular diet Activity: WBAT Follow-up: in 2 weeks Disposition: Home Discharged Condition: good   Discharge Instructions    Call MD / Call 911   Complete by: As directed    If you experience chest pain or shortness of breath, CALL 911 and be transported to the hospital emergency room.  If you develope a fever above 101 F, pus (white drainage) or increased drainage or redness at the wound, or calf pain, call your surgeon's office.   Change dressing   Complete by: As directed    Maintain surgical dressing until follow up in the clinic. If the edges start to pull up, may reinforce with tape. If the dressing is no longer working, may remove and cover with gauze and tape, but must keep the area dry and clean.  Call with any questions or concerns.   Constipation Prevention   Complete by: As directed    Drink plenty of fluids.  Prune juice may be helpful.  You may use a stool  softener, such as Colace (over the counter) 100 mg twice a day.  Use MiraLax (over the counter) for constipation as needed.   Diet - low sodium heart healthy   Complete by: As directed    Discharge instructions   Complete by: As directed    Maintain surgical dressing until follow up in the clinic. If the edges start to pull up, may reinforce with tape. If the dressing is no longer working, may remove and cover with gauze and tape, but must keep the area dry and clean.  Follow up in 2 weeks at [facility]. Call with any questions or concerns.   Increase activity slowly as tolerated   Complete by: As directed    Weight bearing as tolerated with assist device (walker, cane, etc) as directed, use it as long as suggested by your surgeon or therapist, typically at least 4-6 weeks.   TED hose   Complete by: As directed    Use stockings (TED hose) for 2 weeks on both leg(s).  You may remove them at night for sleeping.     Allergies as of 11/26/2020   No Known Allergies     Medication List    STOP taking these medications   traMADol 50 MG tablet Commonly known as: ULTRAM     TAKE these medications   aspirin 81 MG chewable tablet Chew 1 tablet (81 mg total) by mouth 2 (two) times daily for 28 days.   fexofenadine 180 MG tablet Commonly known as: ALLEGRA Take 180 mg by mouth daily as needed for allergies.   fluticasone 50 MCG/ACT nasal spray Commonly known as: FLONASE Place 1 spray into both nostrils daily as needed for allergies or rhinitis.   HYDROcodone-acetaminophen 5-325 MG tablet Commonly known as: NORCO/VICODIN Take 1-2 tablets by mouth every 4 (four) hours as needed for moderate pain (pain score 4-6).   methocarbamol 500 MG tablet Commonly known as: ROBAXIN Take 1 tablet (500 mg total) by mouth every 6 (six) hours as needed for muscle spasms.   multivitamin with minerals Tabs tablet Take 1 tablet by mouth daily.   ondansetron 4 MG tablet Commonly known as: ZOFRAN Take 1  tablet (4 mg total) by mouth every 6 (six) hours as needed for nausea.   Osteo Bi-Flex Adv Triple St Tabs Take 2 tablets by mouth daily.   OVER THE COUNTER MEDICATION Take 2 tablets by mouth daily. Circulation and Vein Support   Synthroid 88 MCG tablet Generic drug: levothyroxine Take 88 mcg by mouth daily before breakfast.   Turmeric 400 MG Caps Take 800 mg by mouth daily.            Discharge Care Instructions  (From admission, onward)         Start     Ordered   11/26/20 0000  Change dressing       Comments: Maintain surgical dressing until follow up in the clinic. If the edges start to pull up, may reinforce with tape. If the dressing is no longer working, may remove and cover with gauze and tape, but must keep the area dry and clean.  Call with any questions or concerns.   11/26/20 0739          Follow-up Information    Paralee Cancel, MD. Schedule an appointment as soon as possible for a visit in 2 weeks.   Specialty: Orthopedic Surgery Contact information: [address] [place] [address] [phone]               Signed: Griffith Citron, PA-C Orthopedic Surgery 12/01/2020, 8:43  AM

## 2020-12-26 DIAGNOSIS — H43813 Vitreous degeneration, bilateral: Secondary | ICD-10-CM | POA: Diagnosis not present

## 2020-12-26 DIAGNOSIS — H524 Presbyopia: Secondary | ICD-10-CM | POA: Diagnosis not present

## 2020-12-26 DIAGNOSIS — H52223 Regular astigmatism, bilateral: Secondary | ICD-10-CM | POA: Diagnosis not present

## 2020-12-26 DIAGNOSIS — H04123 Dry eye syndrome of bilateral lacrimal glands: Secondary | ICD-10-CM | POA: Diagnosis not present

## 2021-01-08 DIAGNOSIS — Z96642 Presence of left artificial hip joint: Secondary | ICD-10-CM | POA: Diagnosis not present

## 2021-01-08 DIAGNOSIS — Z471 Aftercare following joint replacement surgery: Secondary | ICD-10-CM | POA: Diagnosis not present

## 2021-02-01 IMAGING — US US PELVIS LIMITED
1 series · 14 of 20 positions shown · non-contrast
Comparison: CT 02/28/2015

CLINICAL DATA: Palpable left inguinal mass for 3 months, enlarging,
no history of injury. Reported bulging with standing.

EXAM:
LIMITED ULTRASOUND OF PELVIS
TECHNIQUE: Limited transabdominal ultrasound examination of the pelvis was
performed in the indicated area of concern

[Series 1: us pelvis limited · 20 acquisitions, 14 frames shown]
[im 1/20]
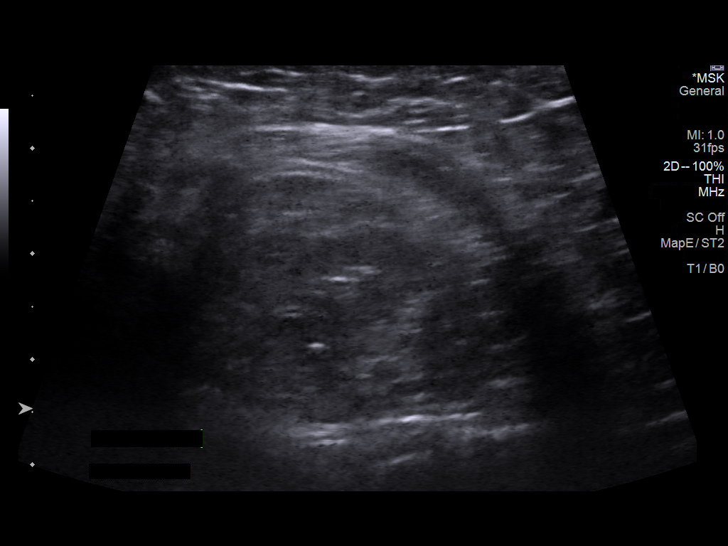
[im 3/20]
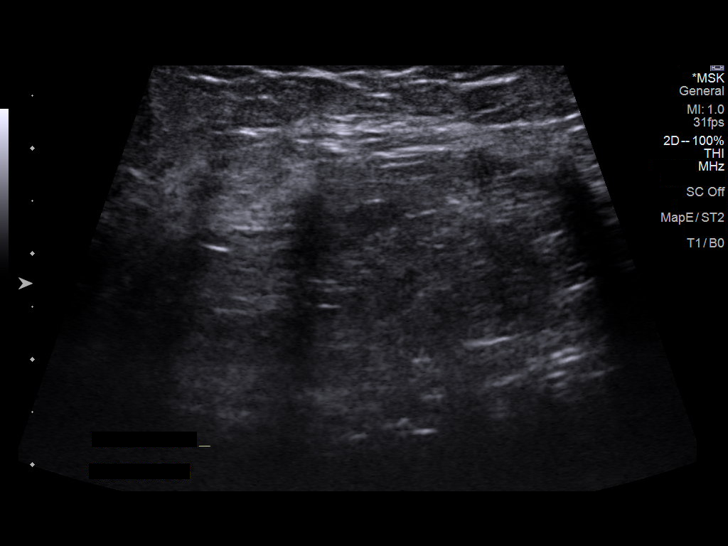
[im 4/20]
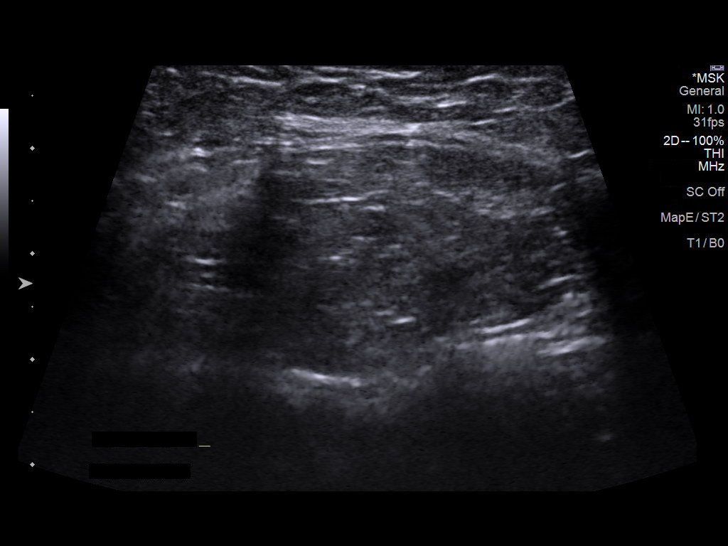
[im 6/20]
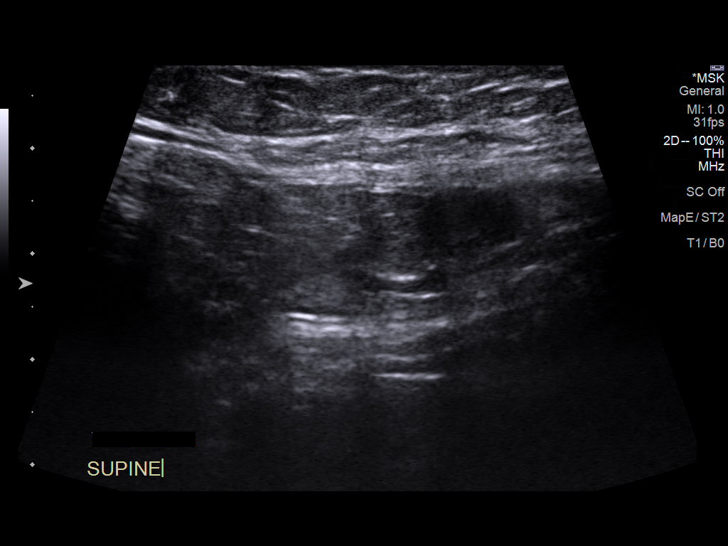
[im 7/20]
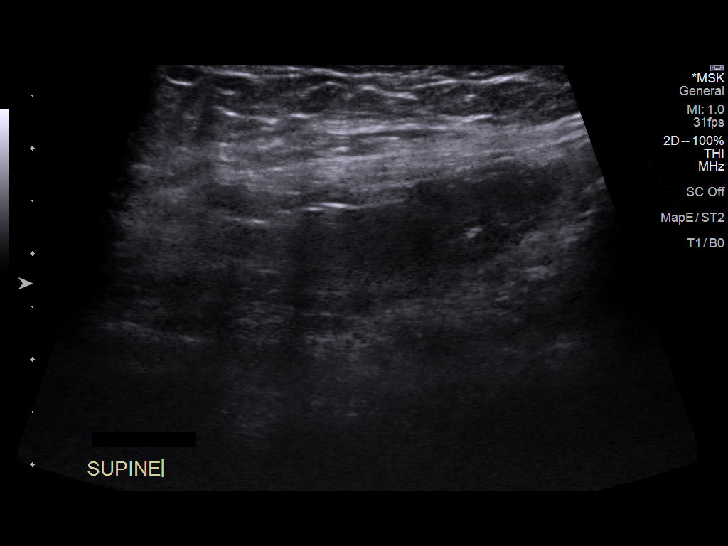
[im 8/20]
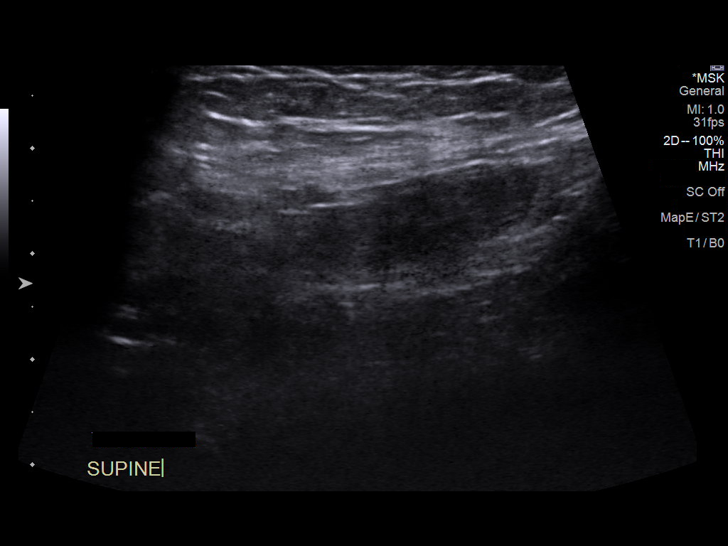
[im 10/20]
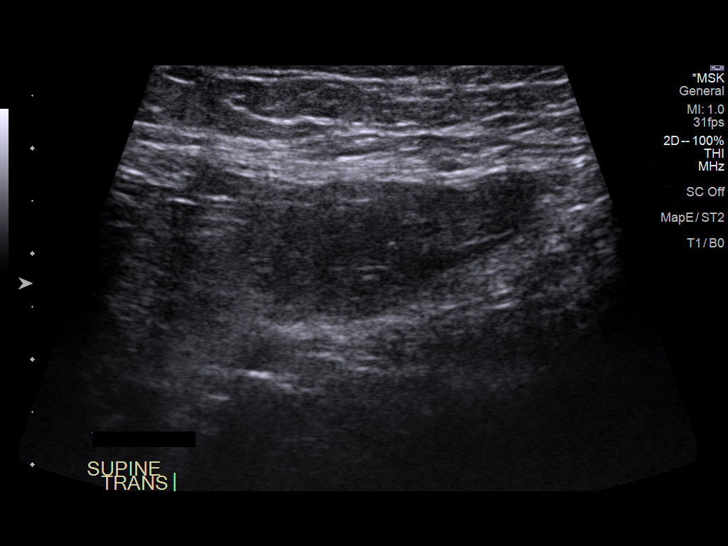
[im 11/20]
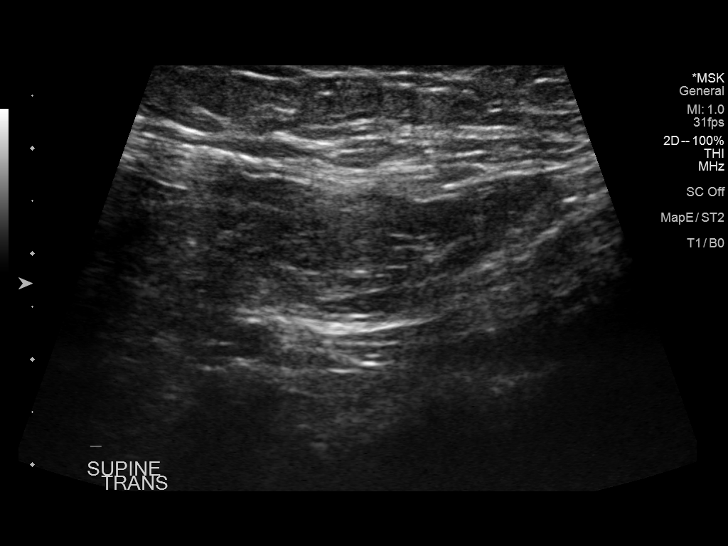
[im 13/20]
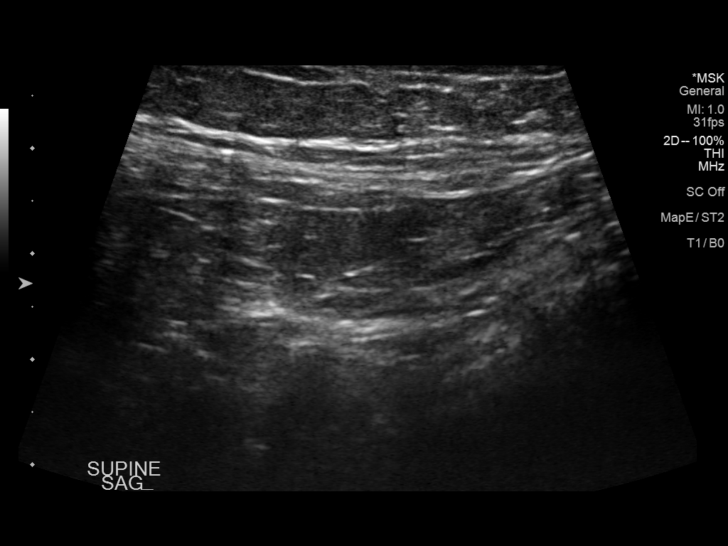
[im 14/20]
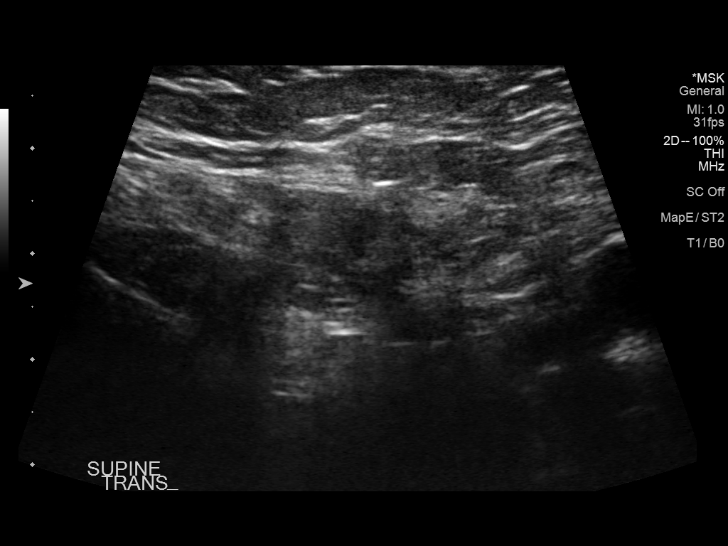
[im 16/20]
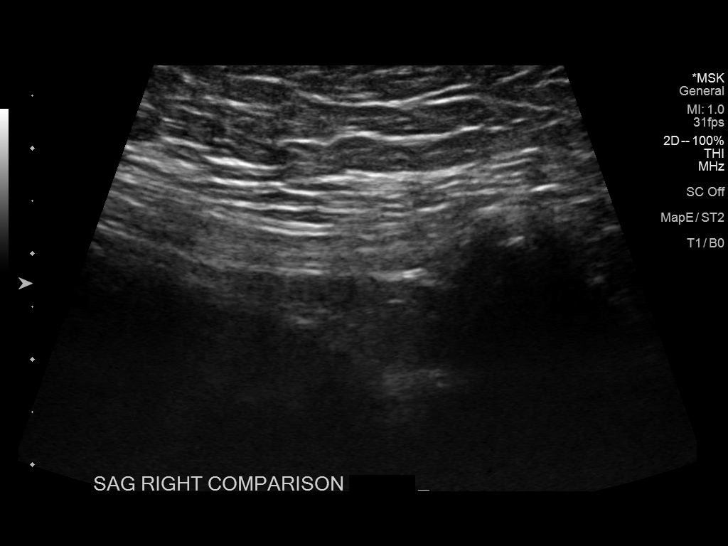
[im 17/20]
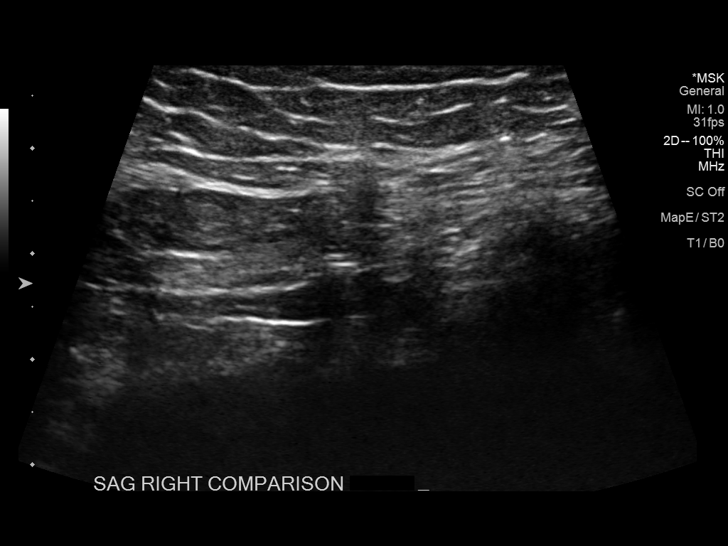
[im 18/20]
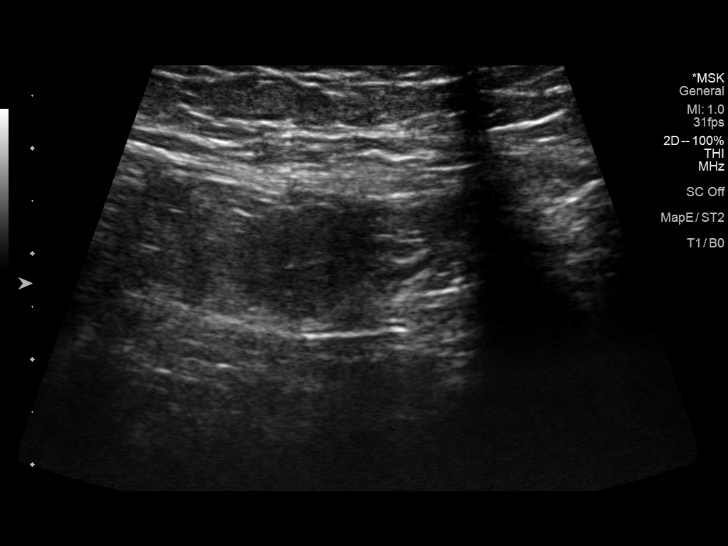
[im 20/20]
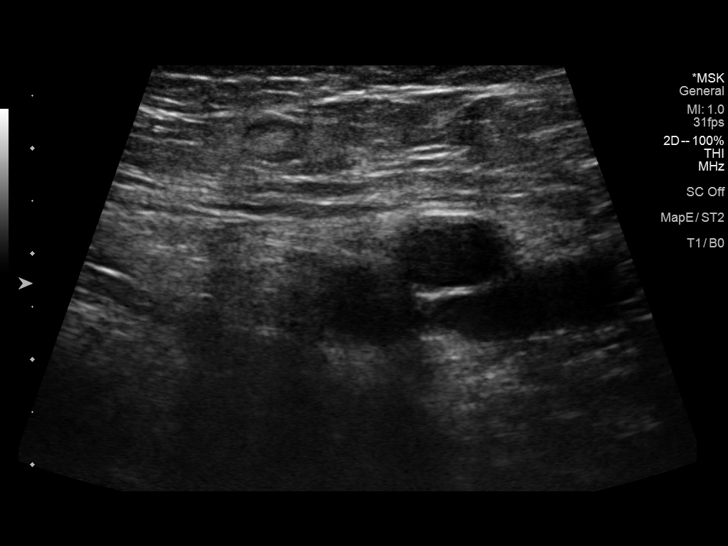

[14 of 20 positions shown; findings below may reference images not displayed]

FINDINGS: Palpable area of concern appears to correspond to a fatty inguinal
hernia which demonstrates some subtle change with provocative
Valsalva maneuver. No discernible herniation of bowel signature is
present. This is certainly more pronounced than on the contralateral
imaging of the right inguinal region. Several normal appearing groin
nodes are present. No concerning mass, fluid collection or other
acute soft tissue abnormality is evident. Review of prior
cross-sectional imaging shows a small fat containing left inguinal
hernia at that time.
IMPRESSION: Area of palpable concern appears to correspond to a small fat
containing left inguinal hernia.

No other worrisome soft tissue lesion or concerning adenopathy.

## 2021-02-04 DIAGNOSIS — L814 Other melanin hyperpigmentation: Secondary | ICD-10-CM | POA: Diagnosis not present

## 2021-02-04 DIAGNOSIS — L821 Other seborrheic keratosis: Secondary | ICD-10-CM | POA: Diagnosis not present

## 2021-02-04 DIAGNOSIS — D2272 Melanocytic nevi of left lower limb, including hip: Secondary | ICD-10-CM | POA: Diagnosis not present

## 2021-02-04 DIAGNOSIS — D225 Melanocytic nevi of trunk: Secondary | ICD-10-CM | POA: Diagnosis not present

## 2021-02-04 DIAGNOSIS — D2271 Melanocytic nevi of right lower limb, including hip: Secondary | ICD-10-CM | POA: Diagnosis not present

## 2021-03-25 DIAGNOSIS — M25521 Pain in right elbow: Secondary | ICD-10-CM | POA: Diagnosis not present

## 2021-03-25 DIAGNOSIS — M24521 Contracture, right elbow: Secondary | ICD-10-CM | POA: Diagnosis not present

## 2021-04-02 DIAGNOSIS — M7711 Lateral epicondylitis, right elbow: Secondary | ICD-10-CM | POA: Diagnosis not present

## 2021-04-02 DIAGNOSIS — M25621 Stiffness of right elbow, not elsewhere classified: Secondary | ICD-10-CM | POA: Diagnosis not present

## 2021-04-07 DIAGNOSIS — M7711 Lateral epicondylitis, right elbow: Secondary | ICD-10-CM | POA: Diagnosis not present

## 2021-04-07 DIAGNOSIS — M25621 Stiffness of right elbow, not elsewhere classified: Secondary | ICD-10-CM | POA: Diagnosis not present

## 2021-04-23 DIAGNOSIS — M25521 Pain in right elbow: Secondary | ICD-10-CM | POA: Diagnosis not present

## 2021-05-26 DIAGNOSIS — M25521 Pain in right elbow: Secondary | ICD-10-CM | POA: Diagnosis not present

## 2021-05-26 DIAGNOSIS — G5621 Lesion of ulnar nerve, right upper limb: Secondary | ICD-10-CM | POA: Diagnosis not present

## 2021-05-30 DIAGNOSIS — M25521 Pain in right elbow: Secondary | ICD-10-CM | POA: Diagnosis not present

## 2021-06-04 DIAGNOSIS — M19021 Primary osteoarthritis, right elbow: Secondary | ICD-10-CM | POA: Diagnosis not present

## 2021-07-07 DIAGNOSIS — M19021 Primary osteoarthritis, right elbow: Secondary | ICD-10-CM | POA: Diagnosis not present

## 2021-07-14 ENCOUNTER — Other Ambulatory Visit: Payer: Self-pay | Admitting: Family Medicine

## 2021-07-14 DIAGNOSIS — Z1231 Encounter for screening mammogram for malignant neoplasm of breast: Secondary | ICD-10-CM

## 2021-07-24 ENCOUNTER — Ambulatory Visit
Admission: RE | Admit: 2021-07-24 | Discharge: 2021-07-24 | Disposition: A | Discharge status: Home / Self Care | Payer: Medicare HMO | Source: Ambulatory Visit | Attending: Family Medicine | Admitting: Family Medicine

## 2021-07-24 ENCOUNTER — Other Ambulatory Visit: Payer: Self-pay

## 2021-07-24 DIAGNOSIS — Z1231 Encounter for screening mammogram for malignant neoplasm of breast: Secondary | ICD-10-CM

## 2021-08-10 IMAGING — DX DG PORTABLE PELVIS
1 series · 1 of 1 positions shown · non-contrast
Comparison: Intraoperative images also on November 25, 2020

CLINICAL DATA: Status post LEFT hip replacement.

EXAM:
PORTABLE PELVIS 1-2 VIEWS

[pelvis ap]
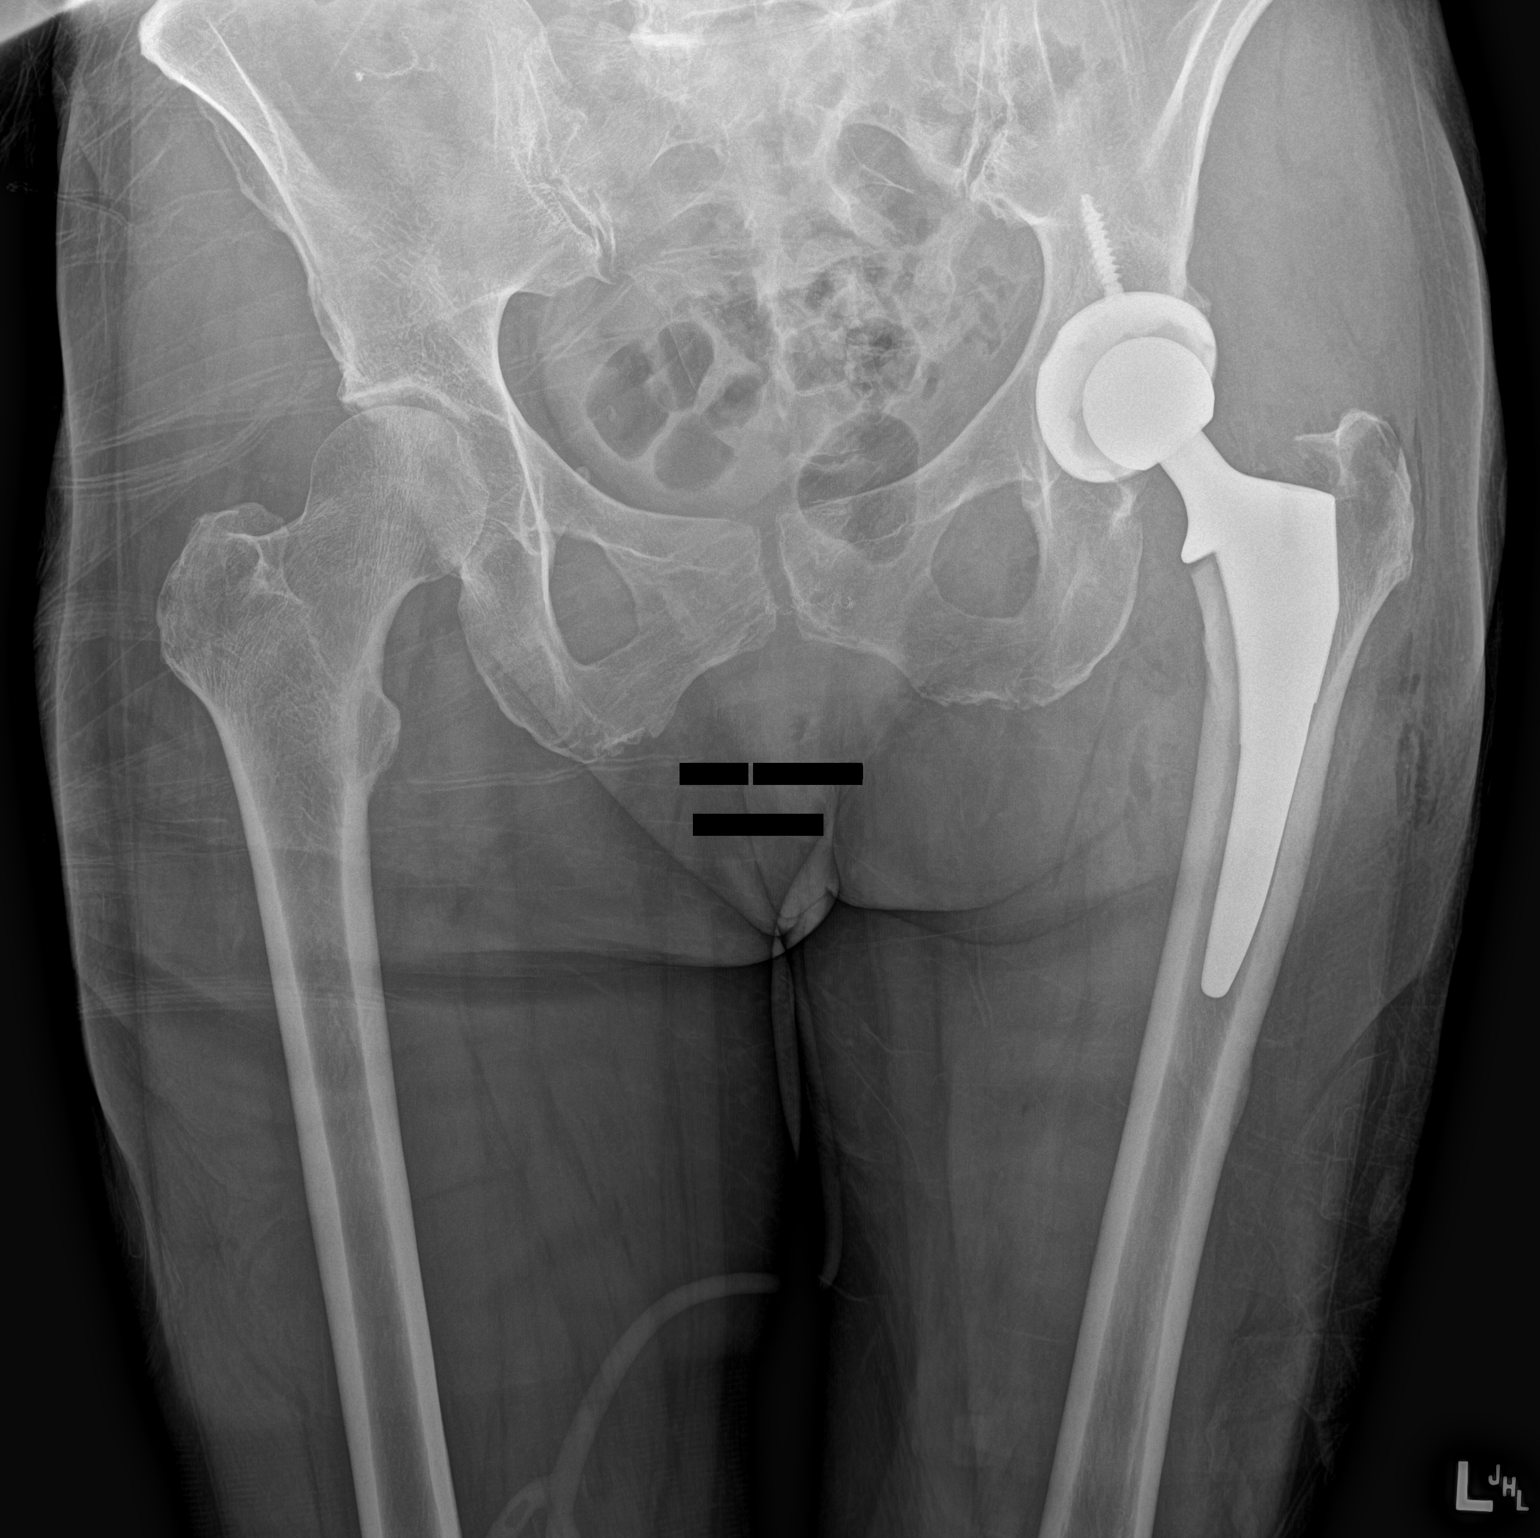

[1 of 1 positions shown; findings below may reference images not displayed]

FINDINGS: Post LEFT hip total arthroplasty. Anatomic alignment on AP view
without complicating features. Postoperative changes in the soft
tissues about the LEFT hip.

No acute osseous abnormality.
IMPRESSION: Post LEFT total hip arthroplasty without immediate complicating
features on AP projection.

## 2021-08-10 IMAGING — RF DG HIP (WITH PELVIS) OPERATIVE*L*
1 series · 4 of 4 positions shown · non-contrast
Comparison: 11/04/2020.

CLINICAL DATA: Left hip surgery.

EXAM:
OPERATIVE LEFT HIP (WITH PELVIS IF PERFORMED) 4 VIEWS
TECHNIQUE: Fluoroscopic spot image(s) were submitted for interpretation
post-operatively.

[Series 1: unknown protocol · 0.20mm/px · 4 of 4 slices shown]
[im 1/4]
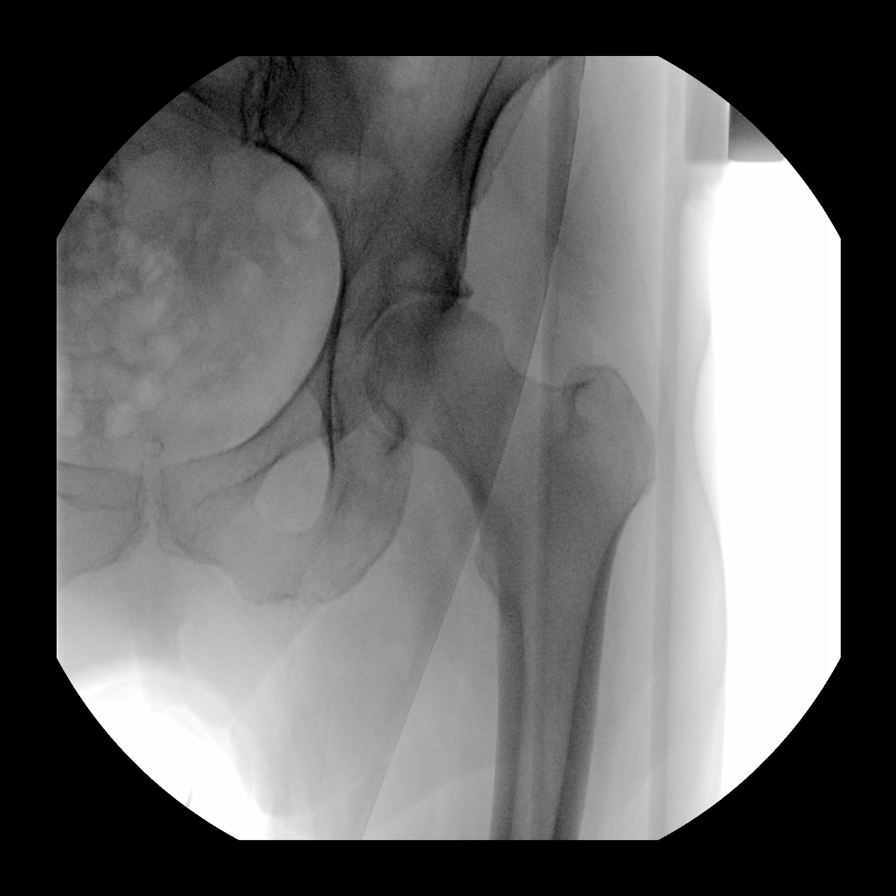
[im 2/4]
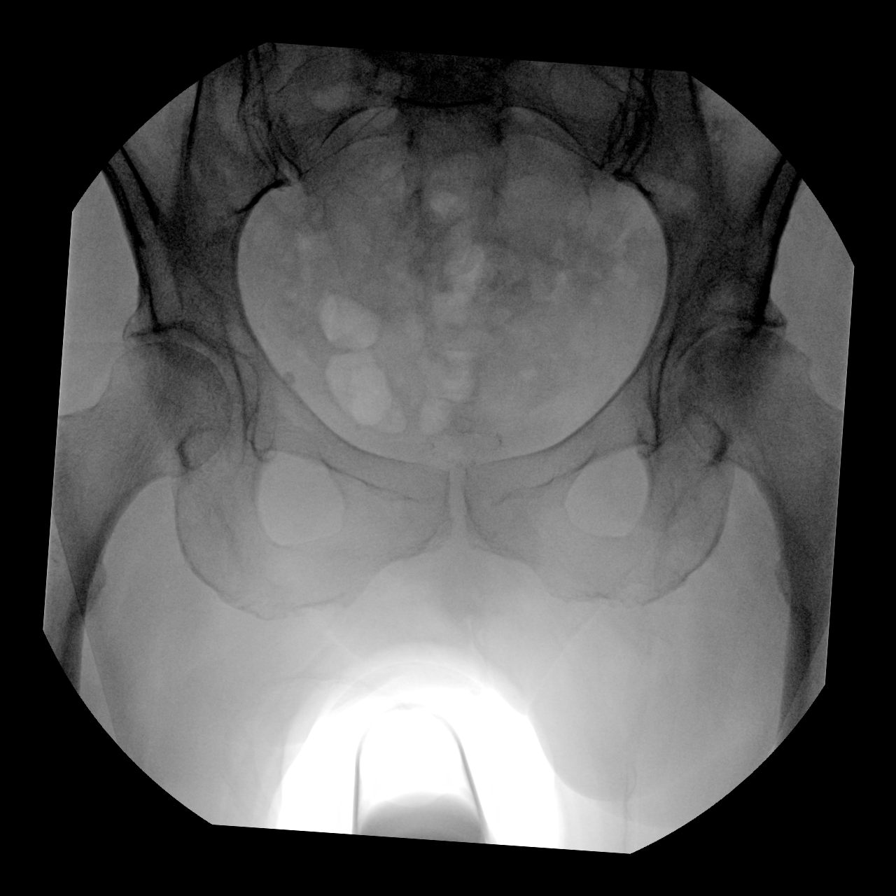
[im 3/4]
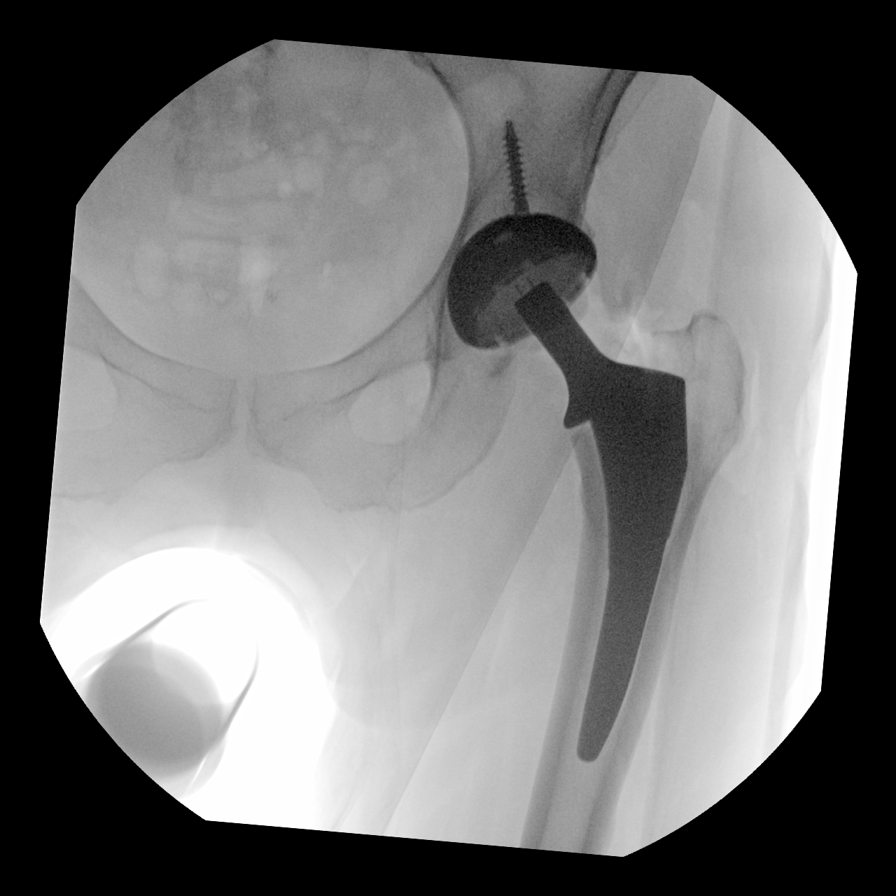
[im 4/4]
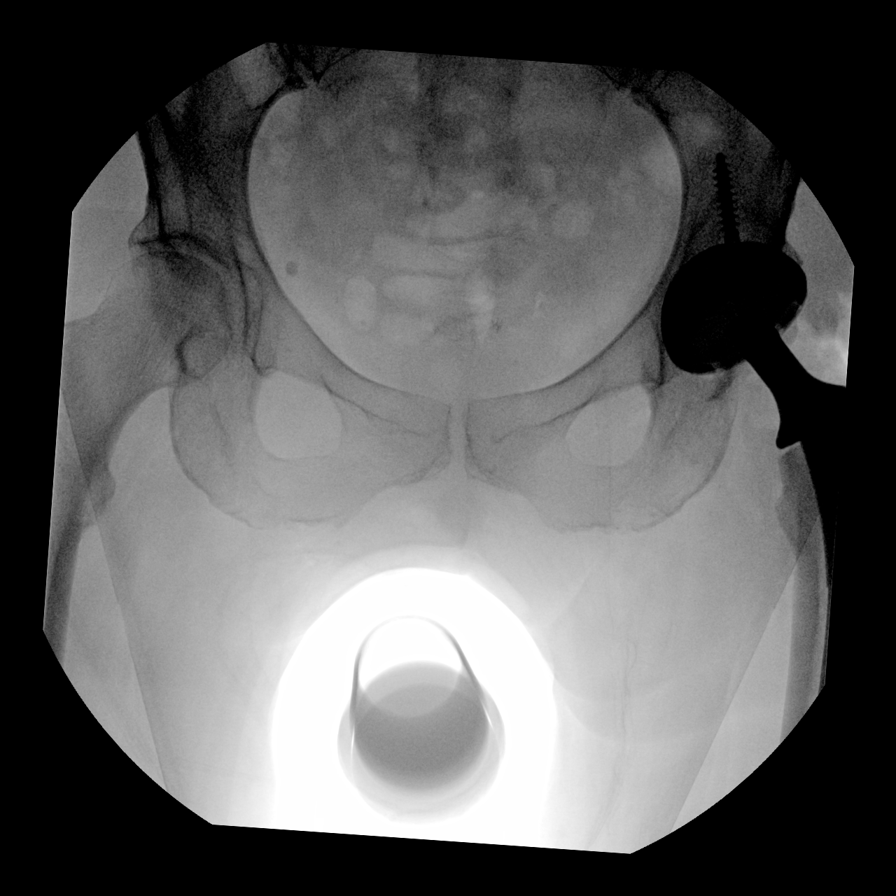

[4 of 4 positions shown; findings below may reference images not displayed]

FINDINGS: Total left hip replacement.  Hardware intact.  Anatomic alignment.
IMPRESSION: Total left hip replacement with anatomic alignment.

## 2021-08-17 DIAGNOSIS — Z23 Encounter for immunization: Secondary | ICD-10-CM | POA: Diagnosis not present

## 2021-08-17 DIAGNOSIS — M25521 Pain in right elbow: Secondary | ICD-10-CM | POA: Diagnosis not present

## 2021-08-17 DIAGNOSIS — B351 Tinea unguium: Secondary | ICD-10-CM | POA: Diagnosis not present

## 2021-08-31 ENCOUNTER — Ambulatory Visit: Payer: Medicare HMO | Attending: Internal Medicine

## 2021-08-31 ENCOUNTER — Other Ambulatory Visit: Payer: Self-pay

## 2021-08-31 ENCOUNTER — Other Ambulatory Visit (HOSPITAL_BASED_OUTPATIENT_CLINIC_OR_DEPARTMENT_OTHER): Payer: Self-pay

## 2021-08-31 DIAGNOSIS — M25521 Pain in right elbow: Secondary | ICD-10-CM | POA: Diagnosis not present

## 2021-08-31 DIAGNOSIS — Z23 Encounter for immunization: Secondary | ICD-10-CM

## 2021-08-31 DIAGNOSIS — E039 Hypothyroidism, unspecified: Secondary | ICD-10-CM | POA: Diagnosis not present

## 2021-08-31 DIAGNOSIS — M1990 Unspecified osteoarthritis, unspecified site: Secondary | ICD-10-CM | POA: Diagnosis not present

## 2021-08-31 DIAGNOSIS — M7701 Medial epicondylitis, right elbow: Secondary | ICD-10-CM | POA: Diagnosis not present

## 2021-08-31 MED ORDER — MODERNA COVID-19 BIVAL BOOSTER 50 MCG/0.5ML IM SUSP
INTRAMUSCULAR | 0 refills | Status: AC
  Filled 2021-08-31: qty 0.5, 1d supply, fill #0

## 2021-08-31 NOTE — Progress Notes (Signed)
   Covid-19 Vaccination Clinic  Name:  [name]    MRN: [id number] DOB: November 07, 1951  08/31/2021  [name] was observed post Covid-19 immunization for 15 minutes without incident. She was provided with Vaccine Information Sheet and instruction to access the V-Safe system.   [name] was instructed to call 911 with any severe reactions post vaccine: Difficulty breathing  Swelling of face and throat  A fast heartbeat  A bad rash all over body  Dizziness and weakness

## 2021-09-07 DIAGNOSIS — M1990 Unspecified osteoarthritis, unspecified site: Secondary | ICD-10-CM | POA: Diagnosis not present

## 2021-09-07 DIAGNOSIS — E039 Hypothyroidism, unspecified: Secondary | ICD-10-CM | POA: Diagnosis not present

## 2021-09-07 DIAGNOSIS — M7701 Medial epicondylitis, right elbow: Secondary | ICD-10-CM | POA: Diagnosis not present

## 2021-09-07 DIAGNOSIS — M25521 Pain in right elbow: Secondary | ICD-10-CM | POA: Diagnosis not present

## 2021-09-09 DIAGNOSIS — E7800 Pure hypercholesterolemia, unspecified: Secondary | ICD-10-CM | POA: Diagnosis not present

## 2021-09-09 DIAGNOSIS — E039 Hypothyroidism, unspecified: Secondary | ICD-10-CM | POA: Diagnosis not present

## 2021-09-09 DIAGNOSIS — M1612 Unilateral primary osteoarthritis, left hip: Secondary | ICD-10-CM | POA: Diagnosis not present

## 2021-10-05 DIAGNOSIS — E039 Hypothyroidism, unspecified: Secondary | ICD-10-CM | POA: Diagnosis not present

## 2021-10-05 DIAGNOSIS — M1990 Unspecified osteoarthritis, unspecified site: Secondary | ICD-10-CM | POA: Diagnosis not present

## 2021-10-05 DIAGNOSIS — M7701 Medial epicondylitis, right elbow: Secondary | ICD-10-CM | POA: Diagnosis not present

## 2021-10-05 DIAGNOSIS — M25521 Pain in right elbow: Secondary | ICD-10-CM | POA: Diagnosis not present

## 2021-10-28 DIAGNOSIS — Z20822 Contact with and (suspected) exposure to covid-19: Secondary | ICD-10-CM | POA: Diagnosis not present

## 2021-10-30 DIAGNOSIS — J029 Acute pharyngitis, unspecified: Secondary | ICD-10-CM | POA: Diagnosis not present

## 2021-10-30 DIAGNOSIS — R059 Cough, unspecified: Secondary | ICD-10-CM | POA: Diagnosis not present

## 2021-11-05 DIAGNOSIS — Z0000 Encounter for general adult medical examination without abnormal findings: Secondary | ICD-10-CM | POA: Diagnosis not present

## 2021-11-05 DIAGNOSIS — K582 Mixed irritable bowel syndrome: Secondary | ICD-10-CM | POA: Diagnosis not present

## 2021-11-05 DIAGNOSIS — K648 Other hemorrhoids: Secondary | ICD-10-CM | POA: Diagnosis not present

## 2021-11-05 DIAGNOSIS — E7800 Pure hypercholesterolemia, unspecified: Secondary | ICD-10-CM | POA: Diagnosis not present

## 2021-11-05 DIAGNOSIS — E039 Hypothyroidism, unspecified: Secondary | ICD-10-CM | POA: Diagnosis not present

## 2021-11-05 DIAGNOSIS — J0100 Acute maxillary sinusitis, unspecified: Secondary | ICD-10-CM | POA: Diagnosis not present

## 2021-11-09 DIAGNOSIS — Z8249 Family history of ischemic heart disease and other diseases of the circulatory system: Secondary | ICD-10-CM | POA: Diagnosis not present

## 2021-11-09 DIAGNOSIS — Z885 Allergy status to narcotic agent status: Secondary | ICD-10-CM | POA: Diagnosis not present

## 2021-11-09 DIAGNOSIS — J45909 Unspecified asthma, uncomplicated: Secondary | ICD-10-CM | POA: Diagnosis not present

## 2021-11-09 DIAGNOSIS — K589 Irritable bowel syndrome without diarrhea: Secondary | ICD-10-CM | POA: Diagnosis not present

## 2021-11-09 DIAGNOSIS — Z881 Allergy status to other antibiotic agents status: Secondary | ICD-10-CM | POA: Diagnosis not present

## 2021-11-09 DIAGNOSIS — Z87891 Personal history of nicotine dependence: Secondary | ICD-10-CM | POA: Diagnosis not present

## 2021-11-09 DIAGNOSIS — K219 Gastro-esophageal reflux disease without esophagitis: Secondary | ICD-10-CM | POA: Diagnosis not present

## 2021-11-09 DIAGNOSIS — E039 Hypothyroidism, unspecified: Secondary | ICD-10-CM | POA: Diagnosis not present

## 2021-11-09 DIAGNOSIS — Z888 Allergy status to other drugs, medicaments and biological substances status: Secondary | ICD-10-CM | POA: Diagnosis not present

## 2021-12-15 DIAGNOSIS — K641 Second degree hemorrhoids: Secondary | ICD-10-CM | POA: Diagnosis not present

## 2021-12-15 DIAGNOSIS — K625 Hemorrhage of anus and rectum: Secondary | ICD-10-CM | POA: Diagnosis not present

## 2021-12-29 DIAGNOSIS — H52223 Regular astigmatism, bilateral: Secondary | ICD-10-CM | POA: Diagnosis not present

## 2021-12-29 DIAGNOSIS — Z0101 Encounter for examination of eyes and vision with abnormal findings: Secondary | ICD-10-CM | POA: Diagnosis not present

## 2021-12-29 DIAGNOSIS — H524 Presbyopia: Secondary | ICD-10-CM | POA: Diagnosis not present

## 2022-01-12 DIAGNOSIS — K641 Second degree hemorrhoids: Secondary | ICD-10-CM | POA: Diagnosis not present

## 2022-03-29 DIAGNOSIS — R399 Unspecified symptoms and signs involving the genitourinary system: Secondary | ICD-10-CM | POA: Diagnosis not present

## 2022-03-29 DIAGNOSIS — R309 Painful micturition, unspecified: Secondary | ICD-10-CM | POA: Diagnosis not present

## 2022-03-29 DIAGNOSIS — R350 Frequency of micturition: Secondary | ICD-10-CM | POA: Diagnosis not present

## 2022-04-08 IMAGING — MG MM DIGITAL SCREENING BILAT W/ TOMO AND CAD
6 series · 6 of 18 positions shown · non-contrast
Comparison: Previous exam(s).

CLINICAL DATA: Screening.

EXAM:
DIGITAL SCREENING BILATERAL MAMMOGRAM WITH TOMOSYNTHESIS AND CAD
TECHNIQUE: Bilateral screening digital craniocaudal and mediolateral oblique
mammograms were obtained. Bilateral screening digital breast
tomosynthesis was performed. The images were evaluated with
computer-aided detection.

[L MLO synth-2D]
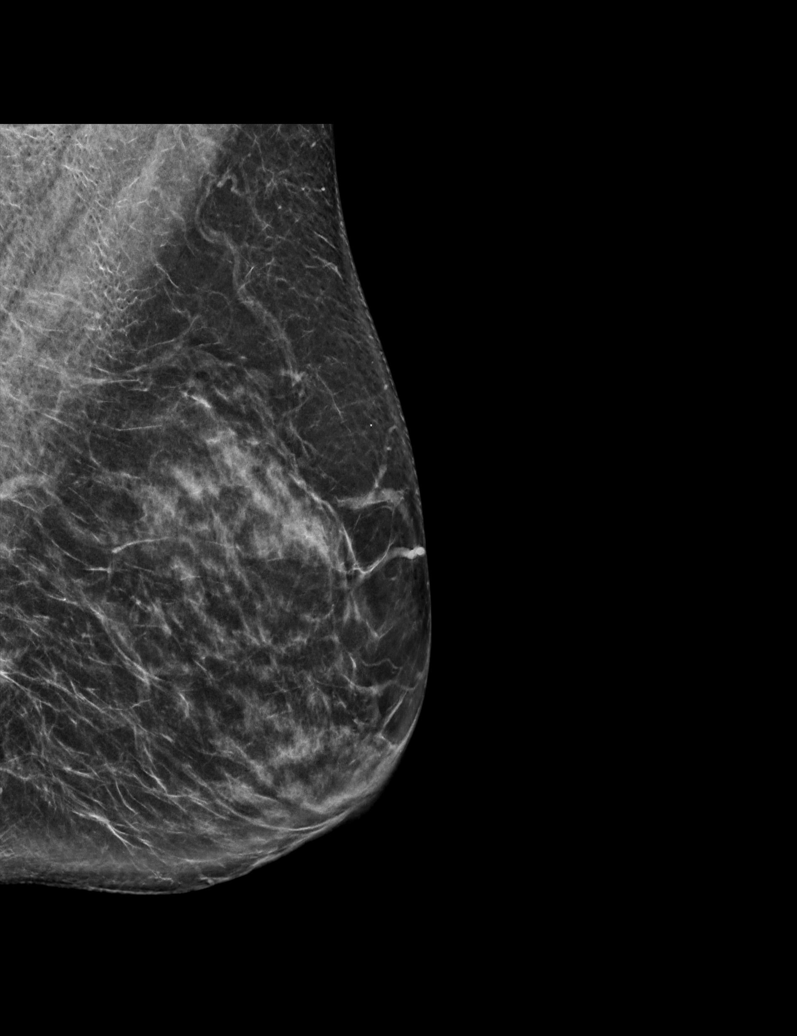

[L CC synth-2D]
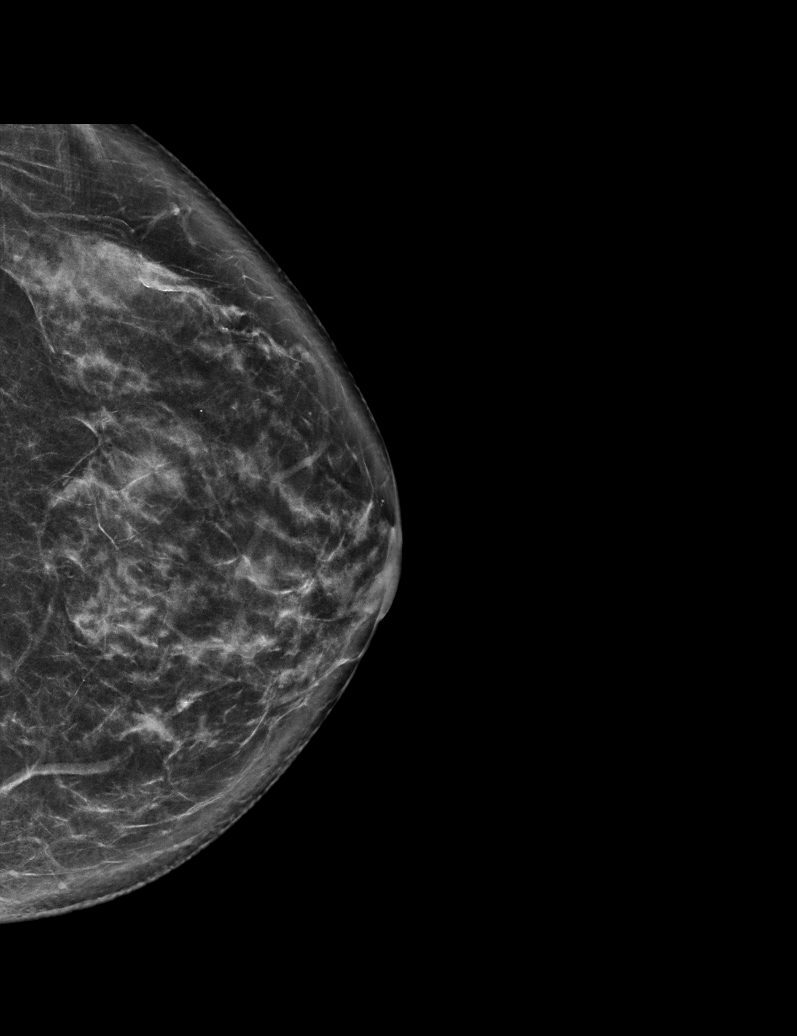

[R MLO synth-2D]
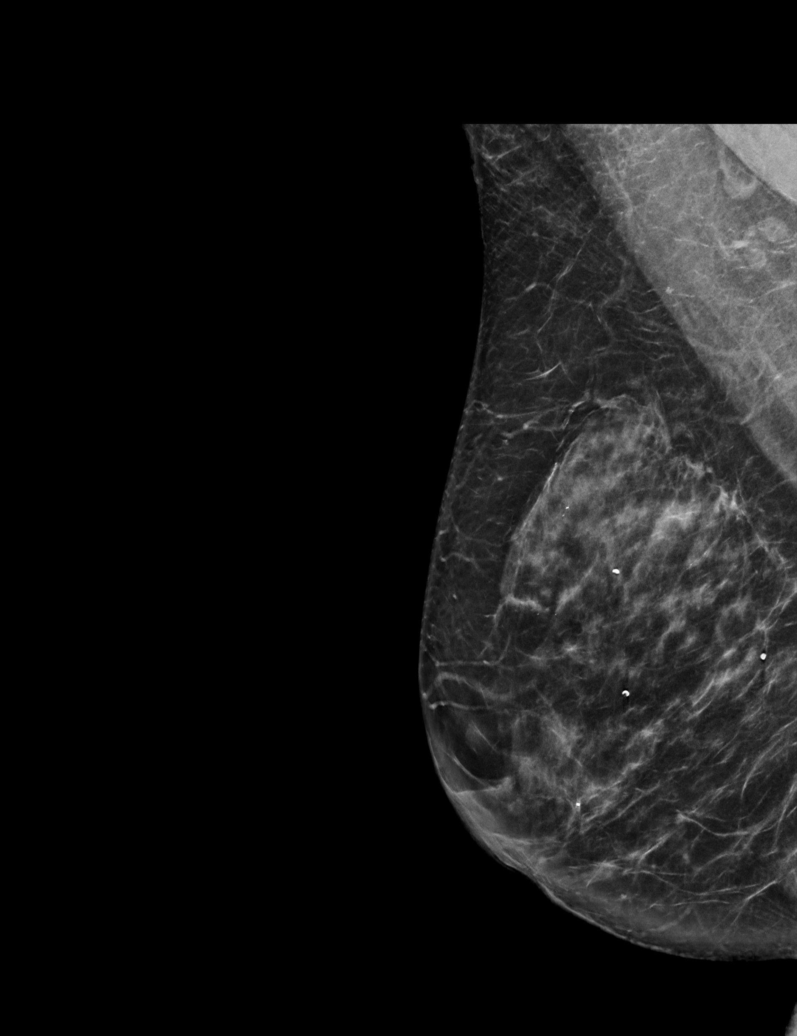

[L CC tomo · tomo slice 36/71.0]
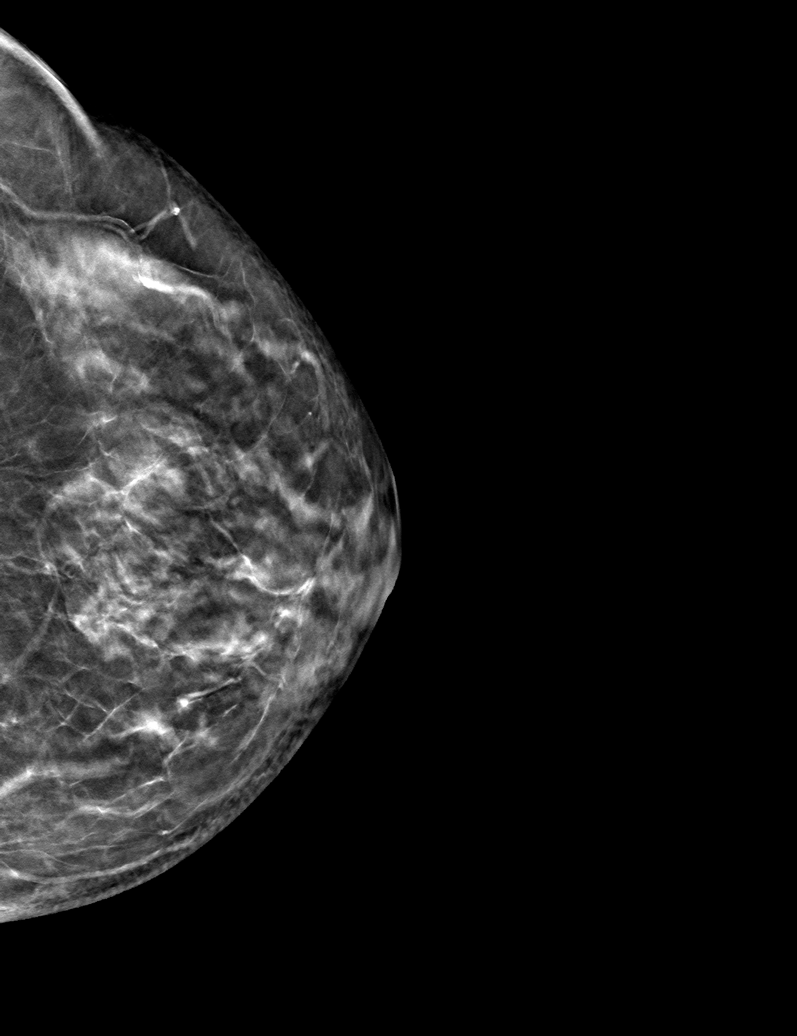

[L MLO tomo · tomo slice 35/70.0]
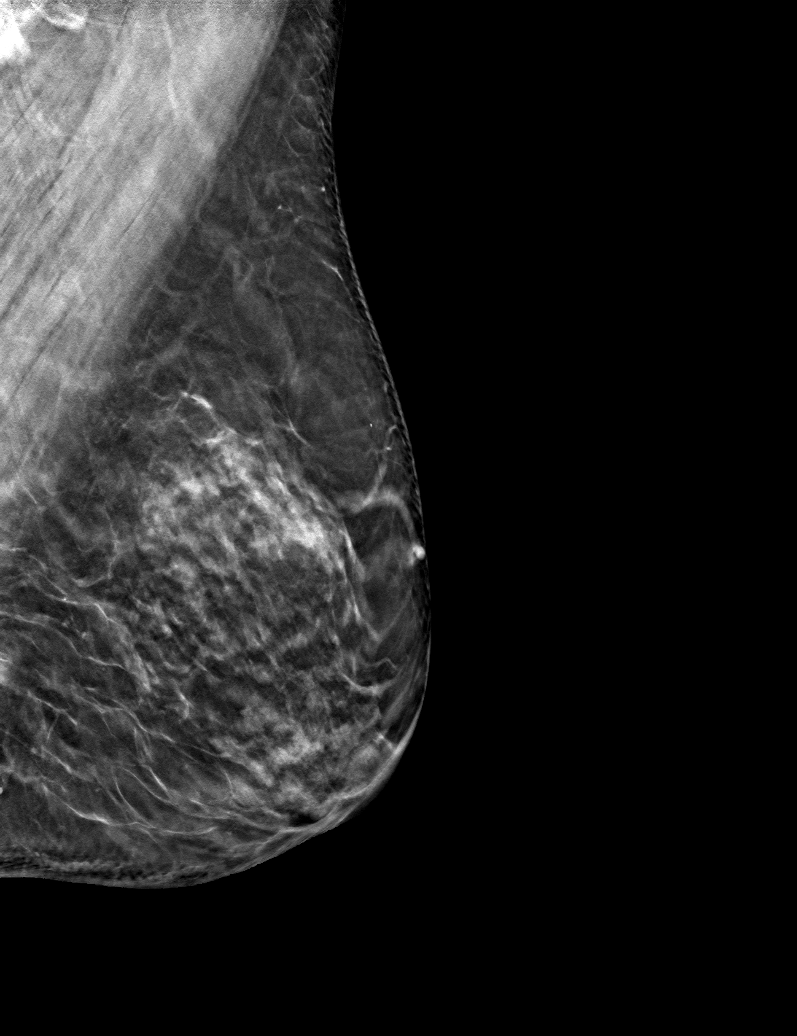

[R CC tomo · tomo slice 37/74.0]
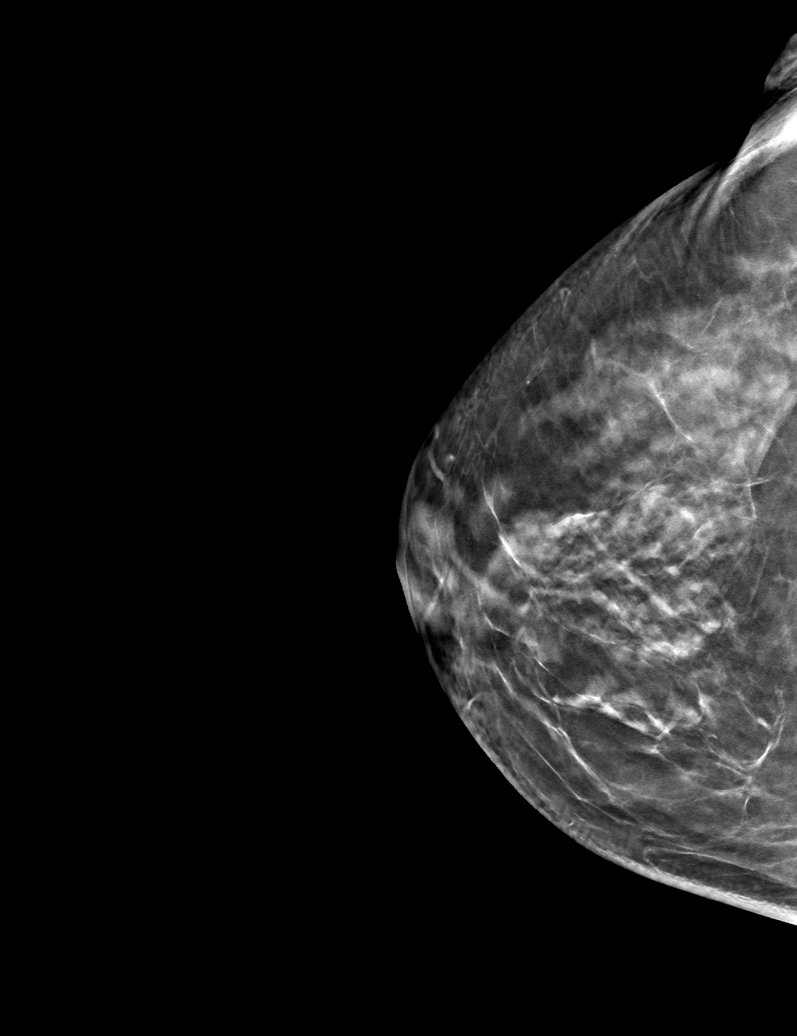

[6 of 18 positions shown; findings below may reference images not displayed]

ACR Breast Density Category c: The breast tissue is heterogeneously
dense, which may obscure small masses.
FINDINGS: There are no findings suspicious for malignancy.
IMPRESSION: No mammographic evidence of malignancy. A result letter of this
screening mammogram will be mailed directly to the patient.

RECOMMENDATION:
Screening mammogram in one year. (Code:Q3-W-BC3)

BI-RADS CATEGORY  1: Negative.

## 2022-04-12 DIAGNOSIS — R051 Acute cough: Secondary | ICD-10-CM | POA: Diagnosis not present

## 2022-04-12 DIAGNOSIS — B9689 Other specified bacterial agents as the cause of diseases classified elsewhere: Secondary | ICD-10-CM | POA: Diagnosis not present

## 2022-04-12 DIAGNOSIS — J309 Allergic rhinitis, unspecified: Secondary | ICD-10-CM | POA: Diagnosis not present

## 2022-04-12 DIAGNOSIS — J019 Acute sinusitis, unspecified: Secondary | ICD-10-CM | POA: Diagnosis not present

## 2022-04-12 DIAGNOSIS — R0981 Nasal congestion: Secondary | ICD-10-CM | POA: Diagnosis not present

## 2022-04-29 ENCOUNTER — Other Ambulatory Visit (HOSPITAL_BASED_OUTPATIENT_CLINIC_OR_DEPARTMENT_OTHER): Payer: Self-pay

## 2022-05-21 DIAGNOSIS — D120 Benign neoplasm of cecum: Secondary | ICD-10-CM | POA: Diagnosis not present

## 2022-05-21 DIAGNOSIS — D123 Benign neoplasm of transverse colon: Secondary | ICD-10-CM | POA: Diagnosis not present

## 2022-05-21 DIAGNOSIS — K6289 Other specified diseases of anus and rectum: Secondary | ICD-10-CM | POA: Diagnosis not present

## 2022-05-21 DIAGNOSIS — D122 Benign neoplasm of ascending colon: Secondary | ICD-10-CM | POA: Diagnosis not present

## 2022-05-21 DIAGNOSIS — Z8371 Family history of colonic polyps: Secondary | ICD-10-CM | POA: Diagnosis not present

## 2022-05-21 DIAGNOSIS — Z800 Family history of malignant neoplasm of digestive organs: Secondary | ICD-10-CM | POA: Diagnosis not present

## 2022-05-21 DIAGNOSIS — K5730 Diverticulosis of large intestine without perforation or abscess without bleeding: Secondary | ICD-10-CM | POA: Diagnosis not present

## 2022-05-21 DIAGNOSIS — K648 Other hemorrhoids: Secondary | ICD-10-CM | POA: Diagnosis not present

## 2022-05-25 DIAGNOSIS — D122 Benign neoplasm of ascending colon: Secondary | ICD-10-CM | POA: Diagnosis not present

## 2022-05-25 DIAGNOSIS — D123 Benign neoplasm of transverse colon: Secondary | ICD-10-CM | POA: Diagnosis not present

## 2022-05-25 DIAGNOSIS — D120 Benign neoplasm of cecum: Secondary | ICD-10-CM | POA: Diagnosis not present

## 2022-06-14 DIAGNOSIS — L814 Other melanin hyperpigmentation: Secondary | ICD-10-CM | POA: Diagnosis not present

## 2022-06-14 DIAGNOSIS — L570 Actinic keratosis: Secondary | ICD-10-CM | POA: Diagnosis not present

## 2022-06-14 DIAGNOSIS — D1801 Hemangioma of skin and subcutaneous tissue: Secondary | ICD-10-CM | POA: Diagnosis not present

## 2022-06-14 DIAGNOSIS — L821 Other seborrheic keratosis: Secondary | ICD-10-CM | POA: Diagnosis not present

## 2022-07-05 DIAGNOSIS — R070 Pain in throat: Secondary | ICD-10-CM | POA: Diagnosis not present

## 2022-07-05 DIAGNOSIS — K143 Hypertrophy of tongue papillae: Secondary | ICD-10-CM | POA: Diagnosis not present

## 2022-07-07 DIAGNOSIS — Z96649 Presence of unspecified artificial hip joint: Secondary | ICD-10-CM | POA: Diagnosis not present

## 2022-07-07 DIAGNOSIS — Z885 Allergy status to narcotic agent status: Secondary | ICD-10-CM | POA: Diagnosis not present

## 2022-07-07 DIAGNOSIS — Z8249 Family history of ischemic heart disease and other diseases of the circulatory system: Secondary | ICD-10-CM | POA: Diagnosis not present

## 2022-07-07 DIAGNOSIS — K589 Irritable bowel syndrome without diarrhea: Secondary | ICD-10-CM | POA: Diagnosis not present

## 2022-07-07 DIAGNOSIS — R32 Unspecified urinary incontinence: Secondary | ICD-10-CM | POA: Diagnosis not present

## 2022-07-07 DIAGNOSIS — J309 Allergic rhinitis, unspecified: Secondary | ICD-10-CM | POA: Diagnosis not present

## 2022-07-07 DIAGNOSIS — Z888 Allergy status to other drugs, medicaments and biological substances status: Secondary | ICD-10-CM | POA: Diagnosis not present

## 2022-07-07 DIAGNOSIS — Z809 Family history of malignant neoplasm, unspecified: Secondary | ICD-10-CM | POA: Diagnosis not present

## 2022-07-07 DIAGNOSIS — Z881 Allergy status to other antibiotic agents status: Secondary | ICD-10-CM | POA: Diagnosis not present

## 2022-07-07 DIAGNOSIS — Z87891 Personal history of nicotine dependence: Secondary | ICD-10-CM | POA: Diagnosis not present

## 2022-07-07 DIAGNOSIS — E039 Hypothyroidism, unspecified: Secondary | ICD-10-CM | POA: Diagnosis not present

## 2022-09-20 DIAGNOSIS — E755 Other lipid storage disorders: Secondary | ICD-10-CM | POA: Diagnosis not present

## 2022-11-09 DIAGNOSIS — E7800 Pure hypercholesterolemia, unspecified: Secondary | ICD-10-CM | POA: Diagnosis not present

## 2022-11-09 DIAGNOSIS — Z96642 Presence of left artificial hip joint: Secondary | ICD-10-CM | POA: Diagnosis not present

## 2022-11-09 DIAGNOSIS — Z0000 Encounter for general adult medical examination without abnormal findings: Secondary | ICD-10-CM | POA: Diagnosis not present

## 2022-11-09 DIAGNOSIS — Z8719 Personal history of other diseases of the digestive system: Secondary | ICD-10-CM | POA: Diagnosis not present

## 2022-11-09 DIAGNOSIS — M25521 Pain in right elbow: Secondary | ICD-10-CM | POA: Diagnosis not present

## 2022-11-09 DIAGNOSIS — E039 Hypothyroidism, unspecified: Secondary | ICD-10-CM | POA: Diagnosis not present

## 2022-11-09 DIAGNOSIS — R5383 Other fatigue: Secondary | ICD-10-CM | POA: Diagnosis not present

## 2022-11-09 DIAGNOSIS — Z13820 Encounter for screening for osteoporosis: Secondary | ICD-10-CM | POA: Diagnosis not present

## 2022-12-07 DIAGNOSIS — M25521 Pain in right elbow: Secondary | ICD-10-CM | POA: Diagnosis not present

## 2023-01-04 DIAGNOSIS — H02885 Meibomian gland dysfunction left lower eyelid: Secondary | ICD-10-CM | POA: Diagnosis not present

## 2023-01-04 DIAGNOSIS — H04123 Dry eye syndrome of bilateral lacrimal glands: Secondary | ICD-10-CM | POA: Diagnosis not present

## 2023-01-04 DIAGNOSIS — H43813 Vitreous degeneration, bilateral: Secondary | ICD-10-CM | POA: Diagnosis not present

## 2023-01-04 DIAGNOSIS — H2513 Age-related nuclear cataract, bilateral: Secondary | ICD-10-CM | POA: Diagnosis not present

## 2023-01-06 DIAGNOSIS — M19021 Primary osteoarthritis, right elbow: Secondary | ICD-10-CM | POA: Diagnosis not present

## 2023-07-12 DIAGNOSIS — H269 Unspecified cataract: Secondary | ICD-10-CM | POA: Diagnosis not present

## 2023-07-12 DIAGNOSIS — E785 Hyperlipidemia, unspecified: Secondary | ICD-10-CM | POA: Diagnosis not present

## 2023-07-12 DIAGNOSIS — Z96642 Presence of left artificial hip joint: Secondary | ICD-10-CM | POA: Diagnosis not present

## 2023-07-12 DIAGNOSIS — J302 Other seasonal allergic rhinitis: Secondary | ICD-10-CM | POA: Diagnosis not present

## 2023-07-12 DIAGNOSIS — E039 Hypothyroidism, unspecified: Secondary | ICD-10-CM | POA: Diagnosis not present

## 2023-07-12 DIAGNOSIS — R32 Unspecified urinary incontinence: Secondary | ICD-10-CM | POA: Diagnosis not present

## 2023-07-12 DIAGNOSIS — Z8249 Family history of ischemic heart disease and other diseases of the circulatory system: Secondary | ICD-10-CM | POA: Diagnosis not present

## 2023-07-12 DIAGNOSIS — M1990 Unspecified osteoarthritis, unspecified site: Secondary | ICD-10-CM | POA: Diagnosis not present

## 2023-07-12 DIAGNOSIS — Z87891 Personal history of nicotine dependence: Secondary | ICD-10-CM | POA: Diagnosis not present

## 2023-07-12 DIAGNOSIS — Z8541 Personal history of malignant neoplasm of cervix uteri: Secondary | ICD-10-CM | POA: Diagnosis not present

## 2023-08-08 ENCOUNTER — Other Ambulatory Visit: Payer: Self-pay | Admitting: Family Medicine

## 2023-08-08 DIAGNOSIS — Z1231 Encounter for screening mammogram for malignant neoplasm of breast: Secondary | ICD-10-CM

## 2023-08-12 ENCOUNTER — Ambulatory Visit
Admission: RE | Admit: 2023-08-12 | Discharge: 2023-08-12 | Disposition: A | Discharge status: Home / Self Care | Payer: Medicare HMO | Source: Ambulatory Visit | Attending: Family Medicine | Admitting: Family Medicine

## 2023-08-12 DIAGNOSIS — Z1231 Encounter for screening mammogram for malignant neoplasm of breast: Secondary | ICD-10-CM | POA: Diagnosis not present

## 2023-08-30 DIAGNOSIS — M25521 Pain in right elbow: Secondary | ICD-10-CM | POA: Diagnosis not present

## 2023-11-18 ENCOUNTER — Other Ambulatory Visit: Payer: Self-pay | Admitting: Family Medicine

## 2023-11-18 DIAGNOSIS — Z13820 Encounter for screening for osteoporosis: Secondary | ICD-10-CM

## 2023-12-02 ENCOUNTER — Other Ambulatory Visit: Payer: Self-pay | Admitting: Family Medicine

## 2023-12-02 DIAGNOSIS — Z0000 Encounter for general adult medical examination without abnormal findings: Secondary | ICD-10-CM

## 2024-01-10 DIAGNOSIS — M62830 Muscle spasm of back: Secondary | ICD-10-CM | POA: Diagnosis not present

## 2024-01-12 DIAGNOSIS — M5431 Sciatica, right side: Secondary | ICD-10-CM | POA: Diagnosis not present

## 2024-01-17 DIAGNOSIS — M5416 Radiculopathy, lumbar region: Secondary | ICD-10-CM | POA: Diagnosis not present

## 2024-01-24 DIAGNOSIS — M5137 Other intervertebral disc degeneration, lumbosacral region with discogenic back pain only: Secondary | ICD-10-CM | POA: Diagnosis not present

## 2024-01-24 DIAGNOSIS — M48061 Spinal stenosis, lumbar region without neurogenic claudication: Secondary | ICD-10-CM | POA: Diagnosis not present

## 2024-01-24 DIAGNOSIS — M5126 Other intervertebral disc displacement, lumbar region: Secondary | ICD-10-CM | POA: Diagnosis not present

## 2024-01-24 DIAGNOSIS — M47816 Spondylosis without myelopathy or radiculopathy, lumbar region: Secondary | ICD-10-CM | POA: Diagnosis not present

## 2024-01-24 DIAGNOSIS — M5416 Radiculopathy, lumbar region: Secondary | ICD-10-CM | POA: Diagnosis not present

## 2024-01-25 DIAGNOSIS — M5416 Radiculopathy, lumbar region: Secondary | ICD-10-CM | POA: Diagnosis not present

## 2024-02-15 DIAGNOSIS — M5126 Other intervertebral disc displacement, lumbar region: Secondary | ICD-10-CM | POA: Diagnosis not present

## 2024-02-15 DIAGNOSIS — M5416 Radiculopathy, lumbar region: Secondary | ICD-10-CM | POA: Diagnosis not present

## 2024-04-04 DIAGNOSIS — H52223 Regular astigmatism, bilateral: Secondary | ICD-10-CM | POA: Diagnosis not present

## 2024-04-04 DIAGNOSIS — H524 Presbyopia: Secondary | ICD-10-CM | POA: Diagnosis not present

## 2024-05-17 DIAGNOSIS — R300 Dysuria: Secondary | ICD-10-CM | POA: Diagnosis not present

## 2024-05-17 DIAGNOSIS — R051 Acute cough: Secondary | ICD-10-CM | POA: Diagnosis not present

## 2024-05-17 DIAGNOSIS — R0981 Nasal congestion: Secondary | ICD-10-CM | POA: Diagnosis not present

## 2024-05-23 DIAGNOSIS — M7711 Lateral epicondylitis, right elbow: Secondary | ICD-10-CM | POA: Diagnosis not present

## 2024-05-23 DIAGNOSIS — M5416 Radiculopathy, lumbar region: Secondary | ICD-10-CM | POA: Diagnosis not present

## 2024-07-24 ENCOUNTER — Other Ambulatory Visit: Payer: Medicare HMO

## 2024-08-13 ENCOUNTER — Other Ambulatory Visit: Payer: Medicare HMO

## 2024-08-13 ENCOUNTER — Ambulatory Visit
Admission: RE | Admit: 2024-08-13 | Discharge: 2024-08-13 | Disposition: A | Discharge status: Home / Self Care | Payer: Medicare HMO | Source: Ambulatory Visit | Attending: Family Medicine | Admitting: Family Medicine

## 2024-08-13 DIAGNOSIS — Z0000 Encounter for general adult medical examination without abnormal findings: Secondary | ICD-10-CM

## 2024-08-13 DIAGNOSIS — Z1231 Encounter for screening mammogram for malignant neoplasm of breast: Secondary | ICD-10-CM | POA: Diagnosis not present

## 2024-09-25 DIAGNOSIS — M25521 Pain in right elbow: Secondary | ICD-10-CM | POA: Diagnosis not present

## 2024-09-25 DIAGNOSIS — M25421 Effusion, right elbow: Secondary | ICD-10-CM | POA: Diagnosis not present

## 2024-09-27 ENCOUNTER — Other Ambulatory Visit (HOSPITAL_BASED_OUTPATIENT_CLINIC_OR_DEPARTMENT_OTHER): Payer: Self-pay | Admitting: Sports Medicine

## 2024-09-27 ENCOUNTER — Ambulatory Visit (HOSPITAL_COMMUNITY)
Admission: RE | Admit: 2024-09-27 | Discharge: 2024-09-27 | Disposition: A | Discharge status: Home / Self Care | Source: Ambulatory Visit | Attending: Sports Medicine | Admitting: Sports Medicine

## 2024-09-27 DIAGNOSIS — M25521 Pain in right elbow: Secondary | ICD-10-CM | POA: Insufficient documentation

## 2024-10-15 DIAGNOSIS — M19021 Primary osteoarthritis, right elbow: Secondary | ICD-10-CM | POA: Diagnosis not present

## 2024-10-23 DIAGNOSIS — M25521 Pain in right elbow: Secondary | ICD-10-CM | POA: Diagnosis not present

## 2024-11-19 DIAGNOSIS — K589 Irritable bowel syndrome without diarrhea: Secondary | ICD-10-CM | POA: Diagnosis not present

## 2024-11-19 DIAGNOSIS — E7800 Pure hypercholesterolemia, unspecified: Secondary | ICD-10-CM | POA: Diagnosis not present

## 2024-11-19 DIAGNOSIS — E039 Hypothyroidism, unspecified: Secondary | ICD-10-CM | POA: Diagnosis not present

## 2024-11-19 DIAGNOSIS — Z23 Encounter for immunization: Secondary | ICD-10-CM | POA: Diagnosis not present

## 2024-11-19 DIAGNOSIS — Z13820 Encounter for screening for osteoporosis: Secondary | ICD-10-CM | POA: Diagnosis not present

## 2024-11-19 DIAGNOSIS — Z0000 Encounter for general adult medical examination without abnormal findings: Secondary | ICD-10-CM | POA: Diagnosis not present
